# Patient Record
Sex: Female | Born: 1985 | Race: White | Hispanic: No | Marital: Married | Smoking: Never smoker
Health system: Southern US, Community
[De-identification: ages and names within clinical notes are randomized; demographics above are authoritative.]

## PROBLEM LIST (undated history)

## (undated) ENCOUNTER — Inpatient Hospital Stay (HOSPITAL_COMMUNITY): Payer: Self-pay

## (undated) DIAGNOSIS — Z789 Other specified health status: Secondary | ICD-10-CM

## (undated) HISTORY — PX: NO PAST SURGERIES: SHX2092

## (undated) HISTORY — DX: Hemochromatosis, unspecified: E83.119

---

## 2006-06-02 ENCOUNTER — Encounter: Admission: RE | Admit: 2006-06-02 | Discharge: 2006-06-02 | Payer: Self-pay | Admitting: Internal Medicine

## 2006-10-28 ENCOUNTER — Emergency Department (HOSPITAL_COMMUNITY): Admission: EM | Admit: 2006-10-28 | Discharge: 2006-10-29 | Payer: Self-pay | Admitting: Emergency Medicine

## 2007-01-24 ENCOUNTER — Emergency Department (HOSPITAL_COMMUNITY): Admission: EM | Admit: 2007-01-24 | Discharge: 2007-01-25 | Payer: Self-pay | Admitting: Emergency Medicine

## 2008-04-18 ENCOUNTER — Emergency Department (HOSPITAL_COMMUNITY): Admission: EM | Admit: 2008-04-18 | Discharge: 2008-04-18 | Payer: Self-pay | Admitting: Emergency Medicine

## 2010-11-29 LAB — RAPID STREP SCREEN (MED CTR MEBANE ONLY): Streptococcus, Group A Screen (Direct): NEGATIVE

## 2010-12-03 LAB — URINALYSIS, ROUTINE W REFLEX MICROSCOPIC
Ketones, ur: NEGATIVE
Nitrite: NEGATIVE
Protein, ur: NEGATIVE

## 2010-12-03 LAB — HERPES SIMPLEX VIRUS CULTURE

## 2010-12-03 LAB — WET PREP, GENITAL
Trich, Wet Prep: NONE SEEN
Yeast Wet Prep HPF POC: NONE SEEN

## 2010-12-03 LAB — URINE MICROSCOPIC-ADD ON

## 2010-12-03 LAB — GC/CHLAMYDIA PROBE AMP, GENITAL

## 2015-02-14 ENCOUNTER — Emergency Department (INDEPENDENT_AMBULATORY_CARE_PROVIDER_SITE_OTHER)
Admission: EM | Admit: 2015-02-14 | Discharge: 2015-02-14 | Disposition: A | Discharge status: Home / Self Care | Payer: PRIVATE HEALTH INSURANCE | Source: Home / Self Care | Attending: Emergency Medicine | Admitting: Emergency Medicine

## 2015-02-14 ENCOUNTER — Encounter (HOSPITAL_COMMUNITY): Payer: Self-pay

## 2015-02-14 DIAGNOSIS — M6248 Contracture of muscle, other site: Secondary | ICD-10-CM | POA: Diagnosis not present

## 2015-02-14 DIAGNOSIS — S46812A Strain of other muscles, fascia and tendons at shoulder and upper arm level, left arm, initial encounter: Secondary | ICD-10-CM | POA: Diagnosis not present

## 2015-02-14 DIAGNOSIS — M62838 Other muscle spasm: Secondary | ICD-10-CM

## 2015-02-14 MED ORDER — DICLOFENAC SODIUM 75 MG PO TBEC: 75.0000 mg | DELAYED_RELEASE_TABLET | Freq: Two times a day (BID) | ORAL | Status: DC

## 2015-02-14 MED ORDER — METAXALONE 800 MG PO TABS: 800.0000 mg | ORAL_TABLET | Freq: Three times a day (TID) | ORAL | Status: DC

## 2015-02-14 MED ORDER — HYDROCODONE-ACETAMINOPHEN 5-325 MG PO TABS: 2.0000 | ORAL_TABLET | ORAL | Status: DC | PRN

## 2015-02-14 NOTE — Discharge Instructions (Signed)
RICE, deep tissue massage, regular NSAIDs and muscle relaxants. Gentle range of motion exercises.

## 2015-02-14 NOTE — ED Notes (Signed)
Pt stated that she fell at work today and injured her left shoulder. Pt stated that the pain radiates up into her neck and down into her arm Pt a;ert and oriented

## 2015-02-14 NOTE — ED Provider Notes (Signed)
HPI  SUBJECTIVE:  Michelle Wilkins is a 29 y.o. female who presents with left upper shoulder pain described as "pinched nerve" with radiation up her neck and down her shoulder after having a patient fall onto her left shoulder earlier today. 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 Symptoms are worse with rotation of her head, extension, lateral bending to the left, pushing out against resistance, no alleviating factors. She tried Goody's powder. She denies any previous history injury to his shoulder. No numbness, tingling, weakness, bruising, neck pain, limitation of motion. LMP 3 months ago, patient states she is on a long cycle of OCPs and is due next week. she denies the possibility of being pregnant. PMD none   History reviewed. No pertinent past medical history.  History reviewed. No pertinent past surgical history.  History reviewed. No pertinent family history.  Social History  Substance Use Topics  . Smoking status: Never Smoker   . Smokeless tobacco: None  . Alcohol Use: No    No current facility-administered medications for this encounter.  Current outpatient prescriptions:  .  diclofenac (VOLTAREN) 75 MG EC tablet, Take 1 tablet (75 mg total) by mouth 2 (two) times daily. Take with food, Disp: 30 tablet, Rfl: 0 .  HYDROcodone-acetaminophen (NORCO/VICODIN) 5-325 MG tablet, Take 2 tablets by mouth every 4 (four) hours as needed for moderate pain., Disp: 20 tablet, Rfl: 0 .  metaxalone (SKELAXIN) 800 MG tablet, Take 1 tablet (800 mg total) by mouth 3 (three) times daily., Disp: 21 tablet, Rfl: 0  No Known Allergies   ROS  As noted in HPI.   Physical Exam  BP 139/94 mmHg  Pulse 84  Temp(Src) 98.4 F (36.9 C) (Oral)  SpO2 98%  LMP 11/15/2014 (Approximate)  Constitutional: Well developed, well nourished, no acute distress Eyes:  EOMI, conjunctiva normal bilaterally HENT: Normocephalic, atraumatic,mucus membranes moist Neck: No C-spine tenderness.  Respiratory:  Normal inspiratory effort Cardiovascular: Normal rate GI: nondistended skin: No rash, skin intact Musculoskeletal:  Tenderness, muscle spasm along the left trapezius, left  shoulder with ROM normal  Drop test normal, clavicle NT , A/C joint NT, scapula NT, proximal humerus NT, shoulder joint NT, Motor strength normal, Sensation intact LT over deltoid region, distal NVI with hand on affected side having intact sensation and strength in the distribution of the median, radial, and ulnar nerve function.  negative tenderness in bicipital groove, negative empty can test, negative liftoff test Neurologic: Alert & oriented x 3, no focal neuro deficits Psychiatric: Speech and behavior appropriate   ED Course   Medications - No data to display  No orders of the defined types were placed in this encounter.    No results found for this or any previous visit (from the past 24 hour(s)). No results found.  ED Clinical Impression  Trapezius muscle strain, left, initial encounter  Trapezius muscle spasm  ED Assessment/Plan  She has no bony tenderness do not feel that imaging is indicated at this time. presentation is most consistent with trapezial muscle strain/sprain. Home with deep tissue massage, NSAIDs, muscle relaxants, Norco for severe pain. follow-up with occupational health. Recommended [facility] [facility] for ongoing primary care.  Discussed  MDM, plan and followup with patient. Discussed sn/sx that should prompt return to the UC or ED. Patient  agrees with plan.   *This clinic note was created using Dragon dictation software. Therefore, there may be occasional mistakes despite careful proofreading.  ?   Melynda Ripple, MD 02/14/15 1725

## 2015-03-09 ENCOUNTER — Ambulatory Visit: Payer: PRIVATE HEALTH INSURANCE | Attending: Family Medicine

## 2015-03-09 ENCOUNTER — Ambulatory Visit: Payer: PRIVATE HEALTH INSURANCE

## 2015-03-09 DIAGNOSIS — M542 Cervicalgia: Secondary | ICD-10-CM

## 2015-03-09 DIAGNOSIS — R6889 Other general symptoms and signs: Secondary | ICD-10-CM

## 2015-03-09 DIAGNOSIS — R293 Abnormal posture: Secondary | ICD-10-CM

## 2015-03-09 DIAGNOSIS — M436 Torticollis: Secondary | ICD-10-CM

## 2015-03-09 NOTE — Patient Instructions (Signed)
Handout of thoracic and whole body rotation to least restricted side for 12-15 sec  X 3-6 reps 1-2x/day

## 2015-03-09 NOTE — Therapy (Signed)
[name], [place], [address] Phone: [phone]   Fax:  [phone]  Physical Therapy Evaluation  Patient Details  Name: Michelle Wilkins MRN: [id number] Date of Birth: 03-Feb-1986 Referring Provider: Odis Luster , MD  Encounter Date: 03/09/2015      PT End of Session - 03/09/15 1002    Visit Number 1   Number of Visits 8   Date for PT Re-Evaluation 04/06/15   Authorization Type Workers compensation [place] - Visit Number 1   Authorization - Number of Visits 8   PT Start Time 0700   PT Stop Time 0800   PT Time Calculation (min) 60 min   Activity Tolerance Patient tolerated treatment well   Behavior During Therapy [facility] for tasks assessed/performed      No past medical history on file.  No past surgical history on file.  There were no vitals filed for this visit.  Visit Diagnosis:  Neck pain on left side - Plan: PT plan of care cert/re-cert  Abnormal posture - Plan: PT plan of care cert/re-cert  Activity intolerance - Plan: PT plan of care cert/re-cert  Stiffness of neck - Plan: PT plan of care cert/re-cert      Subjective Assessment - 03/09/15 0709    Subjective She reports transfering patient to pt RT  when the patient fell and she caught  the patient  sustaining pull to LT arm and neck when pain started . She reports not able to turn head to LT at the time but reports full ROM now. She continues with spasm though muscle relaxors don't help  and pain and burning in neck and upper back.   Limitations Lifting  No lifting over 10 pounds with LT arm, light duty , n patient care   How long can you sit comfortably? Car 60 min, at home  60 min as she can change postions   How long can you stand comfortably? AS needed   How long can you walk comfortably? As needed   Diagnostic tests None   Patient Stated Goals Reduce pain and return to normal work   Currently in Pain? Yes    Pain Score 4    Pain Location Neck   Pain Orientation Left   Pain Descriptors / Indicators Burning;Cramping  Stiffness   Pain Type --  Sub acute   Pain Radiating Towards From base of occiput to posterior shoulder on LT   Pain Onset 1 to 4 weeks ago   Pain Frequency Constant   Aggravating Factors  repetative cervical extension, endof day burning and cramping worse   Pain Relieving Factors ice (doen't last), anti inflammatory, rest   Multiple Pain Sites No            [facility] PT Assessment - 03/09/15 0701    Assessment   Medical Diagnosis Thoracic spine strain   Referring Provider Odis Luster , MD   Onset Date/Surgical Date 02/14/15   Hand Dominance Right   Next MD Visit 03/24/15   Prior Therapy No  She did gentle ROM    Precautions   Precautions None   Restrictions   Weight Bearing Restrictions No   Balance Screen   Has the patient fallen in the past 6 months No   Has the patient had a decrease in activity level because of a fear of falling?  No   Prior Function   Level of Independence Independent   Vocation Full time employment  Continue as staff PT   Vocation Requirements She is limited to 10 pounds LT arm   Leisure walking dog  if dog pulls   Cognition   Overall Cognitive Status Within Functional Limits for tasks assessed   Coordination   Gross Motor Movements are Fluid and Coordinated Yes   Posture/Postural Control   Posture Comments Sits with slumped posture   ROM / Strength   AROM / PROM / Strength AROM;Strength;PROM   AROM   AROM Assessment Site Cervical   Cervical Flexion 55   Cervical Extension 70   Cervical - Right Side Bend 80   Cervical - Left Side Bend 70   Cervical - Right Rotation 30   Cervical - Left Rotation 40   PROM   PROM Assessment Site Cervical   Strength   Overall Strength Comments WNL RT and LT UE   Palpation   Palpation comment Possible LT rotation C7 to LT  , tender same area to touch and along LT paraspinals   Special Tests     Special Tests Cervical   Cervical Tests Spurling's   Spurling's   Findings Positive   Side Left   Comment pain more local at C7    Ambulation/Gait   Gait Comments normal                   [facility] Adult PT Treatment/Exercise - 03/09/15 0701    Exercises   Exercises Neck   Neck Exercises: Seated   Other Seated Exercise Total motion based thoracic rotation to RT x3 with flexion with improved LT rotation and discomfort.    Modalities   Modalities Electrical Stimulation;Moist Heat   Moist Heat Therapy   Number Minutes Moist Heat 20 Minutes   Moist Heat Location Cervical   Electrical Stimulation   Electrical Stimulation Location Cervical LT to shoulder   Electrical Stimulation Action IFC   Electrical Stimulation Parameters L8   Electrical Stimulation Goals Pain                PT Education - 03/09/15 0751    Education provided Yes   Education Details POC, rotation stretches   Person(s) Educated Patient   Methods Explanation;Demonstration;Verbal cues;Handout   Comprehension Verbalized understanding;Returned demonstration          PT Short Term Goals - 03/09/15 1007    PT SHORT TERM GOAL #1   Title She will be independent  with initial HEP   Time 2   Period Weeks   Status New   PT SHORT TERM GOAL #2   Title She will report decreased burning in LT neck by 50 % or more   Time 2   Period Weeks   Status New   PT SHORT TERM GOAL #3   Title she will improve LT neck rotation improved  to 80 degrees to equal RT rotation   Time 2   Period Weeks   Status New           PT Long Term Goals - 03/09/15 1008    PT LONG TERM GOAL #1   Title She will be independent with all HEP    Time 4   Period Weeks   Status New   PT LONG TERM GOAL #2   Title She will report no pain/burning in LT neck with general activity at home and work.    Time 4   Period Weeks   Status New   PT LONG TERM GOAL #3   Title She  will be able to lift from floor to waist and knee to  waist 25 pounds with min to no burning /pain to begin to RTW on IP rehab full duty   Time 4   Period Weeks   Status New               Plan - 03/09/15 1003    Clinical Impression Statement Ms Penven-Crew presents with constant burning sensation into LT neck to shoulder. She has mild stiffness with LT cervical rotation. She has increased pain with combo extension/rot/side bend to LT and not to RT. She is on light duty at work. She should do well with PT to resolve issues   Pt will benefit from skilled therapeutic intervention in order to improve on the following deficits Pain;Postural dysfunction;Decreased activity tolerance;Decreased range of motion;Increased muscle spasms   Rehab Potential Good   PT Frequency 2x / week   PT Duration 4 weeks  and if improving extend for 16 visits   PT Treatment/Interventions Iontophoresis 4mg /ml Dexamethasone;Moist Heat;Cryotherapy;Electrical Stimulation;Ultrasound;Therapeutic exercise;Patient/family education;Manual techniques;Passive range of motion;Dry needling;Taping;Therapeutic activities   PT Next Visit Plan Manual techniques and modalities, rotation stretching with total motion trunk rotation   PT Home Exercise Plan trunk rotation to less tiff side to see if neck and trunk ROm and poain improved   Consulted and Agree with Plan of Care Patient         Problem List There are no active problems to display for this patient.   Darrel Hoover PT 03/09/2015, 10:15 AM  [facility] [address] [place], [place], [address] Phone: [phone]   Fax:  [phone]  Name: Michelle Wilkins MRN: [id number] Date of Birth: [birth year]

## 2015-03-10 ENCOUNTER — Ambulatory Visit: Payer: PRIVATE HEALTH INSURANCE | Attending: Family Medicine | Admitting: Physical Therapy

## 2015-03-10 DIAGNOSIS — R293 Abnormal posture: Secondary | ICD-10-CM | POA: Diagnosis present

## 2015-03-10 DIAGNOSIS — M436 Torticollis: Secondary | ICD-10-CM | POA: Diagnosis present

## 2015-03-10 DIAGNOSIS — M542 Cervicalgia: Secondary | ICD-10-CM | POA: Insufficient documentation

## 2015-03-10 DIAGNOSIS — R6889 Other general symptoms and signs: Secondary | ICD-10-CM | POA: Insufficient documentation

## 2015-03-10 NOTE — Therapy (Signed)
[place], [place], [address] Phone: [phone]   Fax:  [phone]  Physical Therapy Treatment  Patient Details  Name: Michelle Wilkins MRN: [id number] Date of Birth: January 07, 1986 Referring Provider: Odis Luster , MD  Encounter Date: 03/10/2015      PT End of Session - 03/10/15 2021    Visit Number 2   Number of Visits 8   Date for PT Re-Evaluation 04/06/15   Authorization Type Workers compensation [place]   PT Start Time 1630   PT Stop Time 1725   PT Time Calculation (min) 55 min   Activity Tolerance Patient tolerated treatment well   Behavior During Therapy [facility] for tasks assessed/performed      No past medical history on file.  No past surgical history on file.  There were no vitals filed for this visit.  Visit Diagnosis:  Neck pain on left side  Abnormal posture  Activity intolerance  Stiffness of neck      Subjective Assessment - 03/10/15 1636    Subjective "Kirk Ruths e-stime took some of the sorness and burning out of the shoulder"   Currently in Pain? Yes   Pain Score 1    Pain Location Neck   Pain Orientation Left   Pain Onset 1 to 4 weeks ago   Pain Frequency Constant                         [facility] Adult PT Treatment/Exercise - 03/10/15 1719    Neck Exercises: Supine   Neck Retraction 10 reps;5 secs   Shoulder ABduction 15 reps  horizontal with red theraband   Upper Extremity D2 10 reps;Theraband   Theraband Level (UE D2) Level 2 (Red)   Moist Heat Therapy   Number Minutes Moist Heat 10 Minutes   Moist Heat Location Cervical;Shoulder  in supine   Manual Therapy   Manual Therapy Soft tissue mobilization   Soft tissue mobilization instrument assisted STM over the L upper trap/ levator scap, and rhomboids, and cervical multifidus          Trigger Point Dry Needling - 03/10/15 1707    Consent Given? Yes   Education Handout Provided Yes   Muscles Treated Upper Body Upper trapezius;Levator scapulae;Rhomboids  cervical mulifidus   Upper Trapezius Response Twitch reponse elicited;Palpable increased muscle length   Levator Scapulae Response Twitch response elicited;Palpable increased muscle length   Rhomboids Response Twitch response elicited;Palpable increased muscle length              PT Education - 03/10/15 2021    Education provided Yes   Education Details dry needling education   Person(s) Educated Patient   Methods Explanation   Comprehension Verbalized understanding          PT Short Term Goals - 03/09/15 1007    PT SHORT TERM GOAL #1   Title She will be independent  with initial HEP   Time 2   Period Weeks   Status New   PT SHORT TERM GOAL #2   Title She will report decreased burning in LT neck by 50 % or more   Time 2   Period Weeks   Status New   PT SHORT TERM GOAL #3   Title she will improve LT neck rotation improved  to 80 degrees to equal RT rotation   Time 2   Period Weeks   Status New  PT Long Term Goals - 03/09/15 1008    PT LONG TERM GOAL #1   Title She will be independent with all HEP    Time 4   Period Weeks   Status New   PT LONG TERM GOAL #2   Title She will report no pain/burning in LT neck with general activity at home and work.    Time 4   Period Weeks   Status New   PT LONG TERM GOAL #3   Title She will be able to lift from floor to waist and knee to waist 25 pounds with min to no burning /pain to begin to RTW on IP rehab full duty   Time 4   Period Weeks   Status New               Plan - 03/10/15 2022    Clinical Impression Statement Jacklin reports that she is doing better today. She provided consent for dry needing and reported multiple twitches in the L upper trap, levator scapulae and rhomboids. Follow she reported relief with the instrument assisted STM and was able to perform the exercises given without complaint. provided MHP following  todays session to derease DN sorenss which she reproted helped.    PT Next Visit Plan assess response to DN, Manual techniques and modalities, rotation stretching with total motion trunk rotation   Consulted and Agree with Plan of Care Patient        Problem List There are no active problems to display for this patient.  Starr Lake PT, DPT, LAT, ATC  03/10/2015  8:29 PM     [facility] [address] [place], [place], [address] Phone: [phone]   Fax:  [phone]  Name: Michelle Wilkins MRN: [id number] Date of Birth: [birth year]

## 2015-03-16 ENCOUNTER — Ambulatory Visit: Payer: PRIVATE HEALTH INSURANCE | Admitting: Physical Therapy

## 2015-03-16 DIAGNOSIS — M542 Cervicalgia: Secondary | ICD-10-CM

## 2015-03-16 DIAGNOSIS — M436 Torticollis: Secondary | ICD-10-CM

## 2015-03-16 DIAGNOSIS — R6889 Other general symptoms and signs: Secondary | ICD-10-CM

## 2015-03-16 DIAGNOSIS — R293 Abnormal posture: Secondary | ICD-10-CM

## 2015-03-16 NOTE — Therapy (Signed)
[name], [place], [address] Phone: [phone]   Fax:  [phone]  Physical Therapy Treatment  Patient Details  Name: Michelle Wilkins MRN: [id number] Date of Birth: 1985/11/15 Referring Provider: Odis Luster , MD  Encounter Date: 03/16/2015      PT End of Session - 03/16/15 1729    Visit Number 3   Number of Visits 8   Date for PT Re-Evaluation 04/06/15   Authorization Type Workers compensation [place]   PT Start Time 0430   PT Stop Time 0530   PT Time Calculation (min) 60 min   Activity Tolerance Patient tolerated treatment well   Behavior During Therapy [facility] for tasks assessed/performed      No past medical history on file.  No past surgical history on file.  There were no vitals filed for this visit.  Visit Diagnosis:  Neck pain on left side  Abnormal posture  Activity intolerance  Stiffness of neck      Subjective Assessment - 03/16/15 1635    Subjective I am not haveing any radiating pain since last week.  Today I have some burning. and some pain in my Right rhomboid. 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 No/denies  stiffness and tightness   Pain Location Neck   Pain Orientation Left            [facility] PT Assessment - 03/16/15 1708    AROM   Cervical Flexion 65   Cervical Extension 88   Cervical - Right Side Bend 70   Cervical - Left Side Bend 65   Cervical - Right Rotation 63   Cervical - Left Rotation 65   Palpation   Palpation comment Lt rotation C4 to C-7, C 3 rotatied right                     [facility] Adult PT Treatment/Exercise - 03/16/15 1721    Neck Exercises: Supine   Other Supine Exercise Deep neck flexion x 10 with towel roll    Moist Heat Therapy   Number Minutes Moist Heat 15 Minutes   Moist Heat Location Cervical;Shoulder  bilateral   Manual Therapy   Manual Therapy Soft tissue mobilization    Joint Mobilization cervical PA glide, lateral glides bil and overpressure for rotation.   Soft tissue mobilization instrument assisted STM over the L upper trap/ levator scap, and rhomboids, and cervical multifidus   Scapular Mobilization inf/sup, protraction/retraction   Neck Exercises: Stretches   Upper Trapezius Stretch 2 reps;30 seconds  left    Levator Stretch 2 reps;30 seconds  left   Other Neck Stretches utilizing Mulligan technique with towel with distraction and side lean 30 sec hold           Trigger Point Dry Needling - 03/16/15 1640    Consent Given? Yes   Education Handout Provided No  previously given   Muscles Treated Upper Body Upper trapezius;Levator scapulae;Rhomboids;Subscapularis;Suboccipitals muscle group  erector spinae C3 R and L rib 4, 5, 6 left    Upper Trapezius Response Twitch reponse elicited;Palpable increased muscle length  left   SubOccipitals Response Palpable increased muscle length  right   Levator Scapulae Response Palpable increased muscle length  left   Rhomboids Response Twitch response elicited;Palpable increased muscle length   Subscapularis Response Twitch response elicited;Palpable increased muscle length      Pt monitored closely for response to TDN  PT Short Term Goals - 03/16/15 1634    PT SHORT TERM GOAL #1   Title She will be independent  with initial HEP   Time 2   Period Weeks   Status On-going   PT SHORT TERM GOAL #2   Title She will report decreased burning in LT neck by 50 % or more   Time 2   Period Weeks   Status On-going   PT SHORT TERM GOAL #3   Title she will improve LT neck rotation improved  to 80 degrees to equal RT rotation   Time 2   Period Weeks   Status On-going           PT Long Term Goals - 03/16/15 1634    PT LONG TERM GOAL #1   Title She will be independent with all HEP    Time 4   Period Weeks   Status On-going   PT LONG TERM GOAL #2   Title She will report no pain/burning  in LT neck with general activity at home and work.    Time 4   Period Weeks   Status On-going   PT LONG TERM GOAL #3   Title She will be able to lift from floor to waist and knee to waist 25 pounds with min to no burning /pain to begin to RTW on IP rehab full duty   Time 4   Period Weeks   Status On-going               Plan - 03/16/15 1729    Clinical Impression Statement Hiawatha reports that she has no radiating pain down her arm..  pt with residual rhomboid burning and Left subscapular pain.  Pt with rotatied to right C-3 spinous process. AROM is WNL  cervical flexion 65, ext 88, Rgiht Side bend 70, left 65, Right rotation 63, left rotation 65.  Pt reviewed Upper trap, Levator stretch.   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   Pt will benefit from skilled therapeutic intervention in order to improve on the following deficits Pain;Postural dysfunction;Decreased activity tolerance;Decreased range of motion;Increased muscle spasms   Rehab Potential Good   PT Frequency 2x / week   PT Duration 4 weeks   PT Treatment/Interventions Iontophoresis 4mg /ml Dexamethasone;Moist Heat;Cryotherapy;Electrical Stimulation;Ultrasound;Therapeutic exercise;Patient/family education;Manual techniques;Passive range of motion;Dry needling;Taping;Therapeutic activities   PT Next Visit Plan  Manual techniques and modalities, review exercises , stretches and deep neck flexion   PT Home Exercise Plan Continue HEP   Consulted and Agree with Plan of Care Patient        Problem List There are no active problems to display for this patient.   Michelle Wilkins, PT 03/16/2015 5:38 PM Phone: [phone] Fax: [facility] [address] [place], [place], [address] Phone: [phone]   Fax:  [phone]  Name: Michelle Wilkins MRN: [id number] Date of Birth: [birth year]

## 2015-03-18 ENCOUNTER — Ambulatory Visit: Payer: PRIVATE HEALTH INSURANCE | Admitting: Physical Therapy

## 2015-03-18 DIAGNOSIS — R6889 Other general symptoms and signs: Secondary | ICD-10-CM

## 2015-03-18 DIAGNOSIS — R293 Abnormal posture: Secondary | ICD-10-CM

## 2015-03-18 DIAGNOSIS — M436 Torticollis: Secondary | ICD-10-CM

## 2015-03-18 DIAGNOSIS — M542 Cervicalgia: Secondary | ICD-10-CM | POA: Diagnosis not present

## 2015-03-18 NOTE — Therapy (Signed)
[place], [place], [address] Phone: [phone]   Fax:  [phone]  Physical Therapy Treatment  Patient Details  Name: Michelle Wilkins MRN: [id number] Date of Birth: Oct 24, 1985 Referring Provider: Odis Luster , MD  Encounter Date: 03/18/2015      PT End of Session - 03/18/15 1632    Visit Number 4   Number of Visits 8   Date for PT Re-Evaluation 04/06/15   Authorization Type Workers compensation [place]   PT Start Time 1545   PT Stop Time 1628   PT Time Calculation (min) 43 min   Activity Tolerance Patient tolerated treatment well   Behavior During Therapy [facility] for tasks assessed/performed      No past medical history on file.  No past surgical history on file.  There were no vitals filed for this visit.  Visit Diagnosis:  Neck pain on left side  Abnormal posture  Activity intolerance  Stiffness of neck      Subjective Assessment - 03/18/15 1551    Subjective I am doing very well no pain or radiating pain, since last thursday or Friday.    Currently in Pain? No/denies                         [facility] Adult PT Treatment/Exercise - 03/18/15 0001    Neck Exercises: Machines for Strengthening   UBE (Upper Arm Bike) L 1.5 x 6 min  alternating direction every 3 min   Other Machines for Strengthening lat pull down machine 2 x 15 1 plate   Neck Exercises: Standing   Neck Retraction 10 reps;3 secs  3 sets, straight back, with L rot/ R rot   Neck Retraction Limitations using wall for feedback   Other Standing Exercises freemotion row/ bench press 1 x 10 10#   Other Standing Exercises bil shoulder flexion/ extension with sustained abduction against bolster with red theraband 2 x 15   Neck Exercises: Prone   Other Prone Exercise hanging off table bil serratus press 2 x 15   Neck Exercises: Stretches   Upper Trapezius Stretch 2 reps;30 seconds   Levator Stretch 2  reps;30 seconds   Other Neck Stretches Rhomboid stretch 1 x 30 sec from door knob with arms crossed                PT Education - 03/18/15 1632    Education provided Yes   Education Details HEP Review   Person(s) Educated Patient   Methods Explanation   Comprehension Verbalized understanding          PT Short Term Goals - 03/16/15 1634    PT SHORT TERM GOAL #1   Title She will be independent  with initial HEP   Time 2   Period Weeks   Status On-going   PT SHORT TERM GOAL #2   Title She will report decreased burning in LT neck by 50 % or more   Time 2   Period Weeks   Status On-going   PT SHORT TERM GOAL #3   Title she will improve LT neck rotation improved  to 80 degrees to equal RT rotation   Time 2   Period Weeks   Status On-going           PT Long Term Goals - 03/16/15 1634    PT LONG TERM GOAL #1   Title She will be independent with all HEP  Time 4   Period Weeks   Status On-going   PT LONG TERM GOAL #2   Title She will report no pain/burning in LT neck with general activity at home and work.    Time 4   Period Weeks   Status On-going   PT LONG TERM GOAL #3   Title She will be able to lift from floor to waist and knee to waist 25 pounds with min to no burning /pain to begin to RTW on IP rehab full duty   Time 4   Period Weeks   Status On-going               Plan - 03/18/15 1633    Clinical Impression Statement Michelle Wilkins reports no pain in the neck or radiating symptoms. Focused todays session on strengthening of the scapular stabilizers and cervical flexors. She was able to complete all exercises wihthout complaint and reported some soreness in the L rhomboid region following todays session. following rhomboid stretching from the door she reported decreased pain. Plan to progress with strengthening as tolerated.    PT Next Visit Plan  Manual techniques and modalities PRN, review exercises , stretches and deep neck flexion, scapular  stabilizers   Consulted and Agree with Plan of Care Patient        Problem List There are no active problems to display for this patient.  Starr Lake PT, DPT, LAT, ATC  03/18/2015  4:36 PM      [facility] [address] [place], [place], [address] Phone: [phone]   Fax:  [phone]  Name: Michelle Wilkins MRN: [id number] Date of Birth: 07-06-1985

## 2015-03-23 ENCOUNTER — Ambulatory Visit: Payer: PRIVATE HEALTH INSURANCE | Admitting: Physical Therapy

## 2015-03-23 DIAGNOSIS — R293 Abnormal posture: Secondary | ICD-10-CM

## 2015-03-23 DIAGNOSIS — M542 Cervicalgia: Secondary | ICD-10-CM

## 2015-03-23 DIAGNOSIS — M436 Torticollis: Secondary | ICD-10-CM

## 2015-03-23 DIAGNOSIS — R6889 Other general symptoms and signs: Secondary | ICD-10-CM

## 2015-03-23 NOTE — Therapy (Signed)
[place], [place], [address] Phone: [phone]   Fax:  [phone]  Physical Therapy Treatment  Patient Details  Name: Michelle Wilkins MRN: [id number] Date of Birth: 01-06-1986 Referring Provider: Odis Luster , MD  Encounter Date: 03/23/2015      PT End of Session - 03/23/15 1717    Visit Number 5   Number of Visits 8   Date for PT Re-Evaluation 04/06/15   PT Start Time 1630   PT Stop Time 1712   PT Time Calculation (min) 42 min   Activity Tolerance Patient tolerated treatment well   Behavior During Therapy [facility] for tasks assessed/performed      No past medical history on file.  No past surgical history on file.  There were no vitals filed for this visit.  Visit Diagnosis:  Neck pain on left side  Abnormal posture  Activity intolerance  Stiffness of neck      Subjective Assessment - 03/23/15 1629    Subjective "I am doing pretty, some soreness after the last session but stretching helped take the soreness away"    Currently in Pain? No/denies                         [facility] Adult PT Treatment/Exercise - 03/23/15 1636    Neck Exercises: Machines for Strengthening   UBE (Upper Arm Bike) L 1.5 x 8 min  alternating direction at 4 min   Neck Exercises: Standing   Upper Extremity D1 10 reps;Theraband   Lift / Chop 10 reps with red theraband    Other Standing Exercises freemotion row/ bench press 2 x 12 7#   Other Standing Exercises supine foam roll routine   Neck Exercises: Prone   Other Prone Exercise walks-out with feet on physioball  x 10   cues to keep shoulders protracted   Neck Exercises: Stretches   Upper Trapezius Stretch 2 reps;30 seconds   Levator Stretch 2 reps;30 seconds   Other Neck Stretches Rhomboid stretch 1 x 30 sec from door knob with arms crossed                PT Education - 03/23/15 1717    Education provided Yes   Education  Details to cancel next visit and assess 1 week out how theprogress she is making.    Person(s) Educated Patient   Methods Explanation   Comprehension Verbalized understanding          PT Short Term Goals - 03/23/15 1719    PT SHORT TERM GOAL #1   Title She will be independent  with initial HEP   Time 2   Period Weeks   Status Achieved   PT SHORT TERM GOAL #2   Title She will report decreased burning in LT neck by 50 % or more   Time 2   Period Weeks   Status Achieved   PT SHORT TERM GOAL #3   Title she will improve LT neck rotation improved  to 80 degrees to equal RT rotation   Time 2   Period Weeks   Status Unable to assess           PT Long Term Goals - 03/23/15 1720    PT LONG TERM GOAL #1   Title She will be independent with all HEP    Time 4   Period Weeks   Status On-going   PT LONG TERM GOAL #2  Title She will report no pain/burning in LT neck with general activity at home and work.    Time 4   Period Weeks   Status Achieved   PT LONG TERM GOAL #3   Title She will be able to lift from floor to waist and knee to waist 25 pounds with min to no burning /pain to begin to RTW on IP rehab full duty   Period Weeks   Status On-going               Plan - 03/23/15 1717    Clinical Impression Statement Lennie stated she had some soreness after last session but stated it was relieved with stretching and hasn't had any pain for a while. She did well with exercises reporting no pain during or following activiity. Decided to cancel next visit to assess how she is doing to determine if she is nearing discharge.    PT Next Visit Plan  Manual techniques and modalities PRN, review exercises , stretches and deep neck flexion, scapular stabilizers   PT Home Exercise Plan Continue HEP   Consulted and Agree with Plan of Care Patient        Problem List There are no active problems to display for this patient.  Starr Lake PT, DPT, LAT, ATC  03/23/2015   5:21 PM     [facility] [address] [place], [place], [address] Phone: [phone]   Fax:  [phone]  Name: Michelle Wilkins MRN: [id number] Date of Birth: Aug 09, 1985

## 2015-03-26 ENCOUNTER — Encounter: Payer: Self-pay | Admitting: Physical Therapy

## 2015-03-30 ENCOUNTER — Ambulatory Visit: Payer: PRIVATE HEALTH INSURANCE | Attending: Family Medicine | Admitting: Physical Therapy

## 2015-03-30 DIAGNOSIS — M436 Torticollis: Secondary | ICD-10-CM | POA: Insufficient documentation

## 2015-03-30 DIAGNOSIS — M542 Cervicalgia: Secondary | ICD-10-CM | POA: Insufficient documentation

## 2015-03-30 DIAGNOSIS — R293 Abnormal posture: Secondary | ICD-10-CM | POA: Diagnosis present

## 2015-03-30 DIAGNOSIS — R6889 Other general symptoms and signs: Secondary | ICD-10-CM | POA: Diagnosis present

## 2015-03-30 NOTE — Therapy (Signed)
[place], [place], [address] Phone: [phone]   Fax:  [phone]  Physical Therapy Treatment / Discharge note  Patient Details  Name: Michelle Wilkins MRN: [id number] Date of Birth: 06/19/1985 Referring Provider: Odis Luster , MD  Encounter Date: 03/30/2015      PT End of Session - 03/30/15 1700    Visit Number 6   Number of Visits 8   Date for PT Re-Evaluation 04/06/15   Authorization Type Workers compensation [place]   PT Start Time 1630   PT Stop Time 1700   PT Time Calculation (min) 30 min   Activity Tolerance Patient tolerated treatment well   Behavior During Therapy [facility] for tasks assessed/performed      No past medical history on file.  No past surgical history on file.  There were no vitals filed for this visit.  Visit Diagnosis:  Neck pain on left side  Abnormal posture  Stiffness of neck  Activity intolerance      Subjective Assessment - 03/30/15 1634    Subjective " I am doing pretty good, I almost worked a full shift and had no problems"   Currently in Pain? No/denies            [facility] PT Assessment - 03/30/15 1637    Observation/Other Assessments   Focus on Therapeutic Outcomes (FOTO)  16% limited   AROM   Cervical Flexion 66   Cervical Extension 90   Cervical - Right Side Bend 70   Cervical - Left Side Bend 66   Cervical - Right Rotation 70   Cervical - Left Rotation 66                             PT Education - 03/30/15 1659    Education provided Yes   Education Details HEP review   Person(s) Educated Patient   Methods Explanation   Comprehension Verbalized understanding          PT Short Term Goals - 03/30/15 1647    PT SHORT TERM GOAL #1   Title She will be independent  with initial HEP   Time 2   Period Weeks   Status Achieved   PT SHORT TERM GOAL #2   Title She will report decreased burning in LT neck by 50 %  or more   Time 2   Period Weeks   Status Achieved   PT SHORT TERM GOAL #3   Title she will improve LT neck rotation improved  to 80 degrees to equal RT rotation   Time 2   Period Weeks   Status Not Met           PT Long Term Goals - 03/30/15 1648    PT LONG TERM GOAL #1   Title She will be independent with all HEP    Time 4   Period Weeks   Status Achieved   PT LONG TERM GOAL #2   Title She will report no pain/burning in LT neck with general activity at home and work.    Time 4   Period Weeks   Status Achieved   PT LONG TERM GOAL #3   Title She will be able to lift from floor to waist and knee to waist 25 pounds with min to no burning /pain to begin to RTW on IP rehab full duty   Time 4   Period Weeks  Status Achieved               Plan - 03/30/15 1701    Clinical Impression Statement Michelle Wilkins reports that she has been doing very well and has worked pretty much a full day without any issues. She has improved her cervical mobility with no report of pain or tightness. She has met all goals except for STG #3. She reports that she is able to maintain and progress her current level of function independently and will be discharged from physical therapy today.    PT Next Visit Plan D/C from physical therapy   PT Home Exercise Plan HEP Review   Consulted and Agree with Plan of Care Patient        Problem List There are no active problems to display for this patient.  Starr Lake PT, DPT, LAT, ATC  03/30/2015  5:08 PM     [place] [facility] [address] [place], [place], [address] Phone: [phone]   Fax:  [phone]  Name: Michelle Wilkins MRN: [id number] Date of Birth: 04-29-1985    PHYSICAL THERAPY DISCHARGE SUMMARY  Visits from Start of Care: 6  Current functional level related to goals / functional outcomes: FOTO 16% limited   Remaining deficits: N/A   Education / Equipment: HEP   Plan: Patient agrees to discharge.  Patient goals were partially met. Patient is being discharged due to being pleased with the current functional level.  ?????

## 2015-04-01 ENCOUNTER — Encounter: Payer: Self-pay | Admitting: Physical Therapy

## 2015-04-13 MED FILL — EMOQUETTE 28 DAY TABLET: 0.15-30 | 63 days supply | Qty: 84 | Fill #1

## 2015-06-03 DIAGNOSIS — H5213 Myopia, bilateral: Secondary | ICD-10-CM | POA: Diagnosis not present

## 2015-06-15 DIAGNOSIS — D1801 Hemangioma of skin and subcutaneous tissue: Secondary | ICD-10-CM | POA: Diagnosis not present

## 2015-06-15 DIAGNOSIS — D235 Other benign neoplasm of skin of trunk: Secondary | ICD-10-CM | POA: Diagnosis not present

## 2015-06-15 DIAGNOSIS — L812 Freckles: Secondary | ICD-10-CM | POA: Diagnosis not present

## 2015-07-07 MED FILL — JULEBER 0.15-30 MG-MCG TABS: 0.15-30 | 63 days supply | Qty: 84 | Fill #2

## 2015-08-26 DIAGNOSIS — Z1389 Encounter for screening for other disorder: Secondary | ICD-10-CM | POA: Diagnosis not present

## 2015-08-26 DIAGNOSIS — Z6822 Body mass index (BMI) 22.0-22.9, adult: Secondary | ICD-10-CM | POA: Diagnosis not present

## 2015-08-26 DIAGNOSIS — Z01419 Encounter for gynecological examination (general) (routine) without abnormal findings: Secondary | ICD-10-CM | POA: Diagnosis not present

## 2015-08-26 DIAGNOSIS — Z130 Encounter for screening for diseases of the blood and blood-forming organs and certain disorders involving the immune mechanism: Secondary | ICD-10-CM | POA: Diagnosis not present

## 2015-08-26 DIAGNOSIS — Z793 Long term (current) use of hormonal contraceptives: Secondary | ICD-10-CM | POA: Diagnosis not present

## 2015-09-08 MED FILL — JULEBER 0.15-30 MG-MCG TABS: 0.15-30 | 63 days supply | Qty: 84 | Fill #0

## 2015-09-24 DIAGNOSIS — Z3202 Encounter for pregnancy test, result negative: Secondary | ICD-10-CM | POA: Diagnosis not present

## 2015-09-24 DIAGNOSIS — Z30430 Encounter for insertion of intrauterine contraceptive device: Secondary | ICD-10-CM | POA: Diagnosis not present

## 2015-10-23 DIAGNOSIS — Z30431 Encounter for routine checking of intrauterine contraceptive device: Secondary | ICD-10-CM | POA: Diagnosis not present

## 2016-06-09 DIAGNOSIS — H5213 Myopia, bilateral: Secondary | ICD-10-CM | POA: Diagnosis not present

## 2016-06-23 DIAGNOSIS — L821 Other seborrheic keratosis: Secondary | ICD-10-CM | POA: Diagnosis not present

## 2016-06-23 DIAGNOSIS — L814 Other melanin hyperpigmentation: Secondary | ICD-10-CM | POA: Diagnosis not present

## 2016-06-23 DIAGNOSIS — D225 Melanocytic nevi of trunk: Secondary | ICD-10-CM | POA: Diagnosis not present

## 2016-10-18 DIAGNOSIS — Z130 Encounter for screening for diseases of the blood and blood-forming organs and certain disorders involving the immune mechanism: Secondary | ICD-10-CM | POA: Diagnosis not present

## 2016-10-18 DIAGNOSIS — Z01419 Encounter for gynecological examination (general) (routine) without abnormal findings: Secondary | ICD-10-CM | POA: Diagnosis not present

## 2016-10-18 DIAGNOSIS — Z975 Presence of (intrauterine) contraceptive device: Secondary | ICD-10-CM | POA: Diagnosis not present

## 2016-10-18 DIAGNOSIS — Z124 Encounter for screening for malignant neoplasm of cervix: Secondary | ICD-10-CM | POA: Diagnosis not present

## 2016-10-18 DIAGNOSIS — Z1151 Encounter for screening for human papillomavirus (HPV): Secondary | ICD-10-CM | POA: Diagnosis not present

## 2016-10-18 DIAGNOSIS — Z1389 Encounter for screening for other disorder: Secondary | ICD-10-CM | POA: Diagnosis not present

## 2016-10-18 DIAGNOSIS — N644 Mastodynia: Secondary | ICD-10-CM | POA: Diagnosis not present

## 2016-12-01 DIAGNOSIS — Z13220 Encounter for screening for lipoid disorders: Secondary | ICD-10-CM | POA: Diagnosis not present

## 2016-12-01 DIAGNOSIS — Z0000 Encounter for general adult medical examination without abnormal findings: Secondary | ICD-10-CM | POA: Diagnosis not present

## 2016-12-01 DIAGNOSIS — G43909 Migraine, unspecified, not intractable, without status migrainosus: Secondary | ICD-10-CM | POA: Diagnosis not present

## 2016-12-12 DIAGNOSIS — R05 Cough: Secondary | ICD-10-CM | POA: Diagnosis not present

## 2016-12-12 DIAGNOSIS — J029 Acute pharyngitis, unspecified: Secondary | ICD-10-CM | POA: Diagnosis not present

## 2016-12-12 DIAGNOSIS — J0100 Acute maxillary sinusitis, unspecified: Secondary | ICD-10-CM | POA: Diagnosis not present

## 2017-01-10 ENCOUNTER — Ambulatory Visit
Admission: RE | Admit: 2017-01-10 | Discharge: 2017-01-10 | Disposition: A | Discharge status: Home / Self Care | Payer: 59 | Source: Ambulatory Visit | Attending: Physician Assistant | Admitting: Physician Assistant

## 2017-01-10 ENCOUNTER — Other Ambulatory Visit: Payer: Self-pay | Admitting: Physician Assistant

## 2017-01-10 DIAGNOSIS — M542 Cervicalgia: Secondary | ICD-10-CM

## 2017-01-10 DIAGNOSIS — S199XXA Unspecified injury of neck, initial encounter: Secondary | ICD-10-CM | POA: Diagnosis not present

## 2017-01-10 MED FILL — CYCLOBENZAPRINE 10 MG TAB: 10 | 5 days supply | Qty: 15 | Fill #0

## 2017-01-10 MED FILL — NAPROXEN 500 MG TABLET: 500 | 15 days supply | Qty: 30 | Fill #0

## 2017-01-18 DIAGNOSIS — Z30432 Encounter for removal of intrauterine contraceptive device: Secondary | ICD-10-CM | POA: Diagnosis not present

## 2017-02-21 NOTE — L&D Delivery Note (Signed)
Delivery Note At 12:06 PM a viable female was delivered via Vaginal, Spontaneous (Presentation: vtx; LOA ).  APGAR: 9, 9; weight pending.   Placenta status: spontaneous, intact.  Cord:  with the following complications: none.   Anesthesia:  Epidural Episiotomy: Median-for tight perineal body delaying delivery Lacerations: None Suture Repair: 2.0 3.0 vicryl rapide Est. Blood Loss (mL):  300  Mom to postpartum.  Baby to Couplet care / Skin to Skin.  Will do circ tomorrow.  Michelle Wilkins 01/13/2018, 12:35 PM

## 2017-03-23 DIAGNOSIS — J0100 Acute maxillary sinusitis, unspecified: Secondary | ICD-10-CM | POA: Diagnosis not present

## 2017-03-23 DIAGNOSIS — J029 Acute pharyngitis, unspecified: Secondary | ICD-10-CM | POA: Diagnosis not present

## 2017-03-23 DIAGNOSIS — J111 Influenza due to unidentified influenza virus with other respiratory manifestations: Secondary | ICD-10-CM | POA: Diagnosis not present

## 2017-05-23 ENCOUNTER — Other Ambulatory Visit: Payer: Self-pay

## 2017-05-23 ENCOUNTER — Encounter (HOSPITAL_COMMUNITY): Payer: Self-pay | Admitting: *Deleted

## 2017-05-23 ENCOUNTER — Inpatient Hospital Stay (HOSPITAL_COMMUNITY)
Admission: AD | Admit: 2017-05-23 | Discharge: 2017-05-23 | Disposition: A | Discharge status: Home / Self Care | Payer: 59 | Source: Ambulatory Visit | Attending: Obstetrics and Gynecology | Admitting: Obstetrics and Gynecology

## 2017-05-23 DIAGNOSIS — R112 Nausea with vomiting, unspecified: Secondary | ICD-10-CM | POA: Diagnosis not present

## 2017-05-23 DIAGNOSIS — Z3201 Encounter for pregnancy test, result positive: Secondary | ICD-10-CM | POA: Diagnosis not present

## 2017-05-23 DIAGNOSIS — O210 Mild hyperemesis gravidarum: Secondary | ICD-10-CM | POA: Insufficient documentation

## 2017-05-23 DIAGNOSIS — Z3A01 Less than 8 weeks gestation of pregnancy: Secondary | ICD-10-CM | POA: Diagnosis not present

## 2017-05-23 DIAGNOSIS — N911 Secondary amenorrhea: Secondary | ICD-10-CM | POA: Diagnosis not present

## 2017-05-23 LAB — COMPREHENSIVE METABOLIC PANEL
ALBUMIN: 4.4 g/dL (ref 3.5–5.0)
ALK PHOS: 52 U/L (ref 38–126)
ALT: 22 U/L (ref 14–54)
ANION GAP: 9 (ref 5–15)
AST: 26 U/L (ref 15–41)
BUN: 16 mg/dL (ref 6–20)
CO2: 23 mmol/L (ref 22–32)
Calcium: 9.4 mg/dL (ref 8.9–10.3)
Chloride: 103 mmol/L (ref 101–111)
Creatinine, Ser: 0.76 mg/dL (ref 0.44–1.00)
GFR calc Af Amer: >60 mL/min (ref >60)
GFR calc non Af Amer: >60 mL/min (ref >60)
GLUCOSE: 98 mg/dL (ref 65–99)
POTASSIUM: 4.1 mmol/L (ref 3.5–5.1)
SODIUM: 135 mmol/L (ref 135–145)
Total Bilirubin: 1.4 mg/dL — ABNORMAL HIGH (ref 0.3–1.2)
Total Protein: 7.6 g/dL (ref 6.5–8.1)

## 2017-05-23 LAB — CBC
HCT: 38.2 % (ref 36.0–46.0)
HEMOGLOBIN: 12.6 g/dL (ref 12.0–15.0)
MCH: 28.5 pg (ref 26.0–34.0)
MCHC: 33 g/dL (ref 30.0–36.0)
MCV: 86.4 fL (ref 78.0–100.0)
Platelets: 326 10*3/uL (ref 150–400)
RBC: 4.42 MIL/uL (ref 3.87–5.11)
RDW: 12.3 % (ref 11.5–15.5)
WBC: 7.7 10*3/uL (ref 4.0–10.5)

## 2017-05-23 MED ORDER — LACTATED RINGERS IV BOLUS
1000.0000 mL | Freq: Once | INTRAVENOUS | Status: AC
  Administered 2017-05-23: 1000 mL via INTRAVENOUS

## 2017-05-23 MED ORDER — SCOPOLAMINE 1 MG/3DAYS TD PT72: 1.0000 | MEDICATED_PATCH | TRANSDERMAL | 0 refills | Status: DC

## 2017-05-23 MED ORDER — M.V.I. ADULT IV INJ
Freq: Once | INTRAVENOUS | Status: AC
  Administered 2017-05-23: 13:00:00 via INTRAVENOUS
  Filled 2017-05-23: qty 10

## 2017-05-23 MED ORDER — PROMETHAZINE HCL 25 MG RE SUPP: 25.0000 mg | Freq: Four times a day (QID) | RECTAL | 0 refills | Status: DC | PRN

## 2017-05-23 MED ORDER — PROMETHAZINE HCL 25 MG PO TABS: 12.5000 mg | ORAL_TABLET | Freq: Four times a day (QID) | ORAL | 0 refills | Status: DC | PRN

## 2017-05-23 MED ORDER — SCOPOLAMINE 1 MG/3DAYS TD PT72
1.0000 | MEDICATED_PATCH | TRANSDERMAL | Status: DC
  Administered 2017-05-23: 1.5 mg via TRANSDERMAL
  Filled 2017-05-23: qty 1

## 2017-05-23 MED ORDER — PROMETHAZINE HCL 25 MG/ML IJ SOLN
25.0000 mg | Freq: Once | INTRAMUSCULAR | Status: AC
  Administered 2017-05-23: 25 mg via INTRAVENOUS
  Filled 2017-05-23: qty 1

## 2017-05-23 MED FILL — TRANSDERM-SCOP 1.5 MG/72HR: 1 | 30 days supply | Qty: 10 | Fill #0

## 2017-05-23 MED FILL — PROMETHAZINE 25 MG SUPP: 25 | 3 days supply | Qty: 12 | Fill #0

## 2017-05-23 MED FILL — PROMETHAZINE 25 MG TABLET: 25 | 3 days supply | Qty: 30 | Fill #0

## 2017-05-23 NOTE — MAU Note (Signed)
Patient sent from Dr Terri Piedra office for IVF and labs. Pt early pregnant at [redacted] weeks gestation, unable to keep down fluids for 4 days.

## 2017-05-23 NOTE — Discharge Instructions (Signed)
Morning Sickness °Morning sickness is when you feel sick to your stomach (nauseous) during pregnancy. This nauseous feeling may or may not come with vomiting. It often occurs in the morning but can be a problem any time of day. Morning sickness is most common during the first trimester, but it may continue throughout pregnancy. While morning sickness is unpleasant, it is usually harmless unless you develop severe and continual vomiting (hyperemesis gravidarum). This condition requires more intense treatment. °What are the causes? °The cause of morning sickness is not completely known but seems to be related to normal hormonal changes that occur in pregnancy. °What increases the risk? °You are at greater risk if you: °· Experienced nausea or vomiting before your pregnancy. °· Had morning sickness during a previous pregnancy. °· Are pregnant with more than one baby, such as twins. ° °How is this treated? °Do not use any medicines (prescription, over-the-counter, or herbal) for morning sickness without first talking to your health care provider. Your health care provider may prescribe or recommend: °· Vitamin B6 supplements. °· Anti-nausea medicines. °· The herbal medicine ginger. ° °Follow these instructions at home: °· Only take over-the-counter or prescription medicines as directed by your health care provider. °· Taking multivitamins before getting pregnant can prevent or decrease the severity of morning sickness in most women. °· Eat a piece of dry toast or unsalted crackers before getting out of bed in the morning. °· Eat five or six small meals a day. °· Eat dry and bland foods (rice, baked potato). Foods high in carbohydrates are often helpful. °· Do not drink liquids with your meals. Drink liquids between meals. °· Avoid greasy, fatty, and spicy foods. °· Get someone to cook for you if the smell of any food causes nausea and vomiting. °· If you feel nauseous after taking prenatal vitamins, take the vitamins at  night or with a snack. °· Snack on protein foods (nuts, yogurt, cheese) between meals if you are hungry. °· Eat unsweetened gelatins for desserts. °· Wearing an acupressure wristband (worn for sea sickness) may be helpful. °· Acupuncture may be helpful. °· Do not smoke. °· Get a humidifier to keep the air in your house free of odors. °· Get plenty of fresh air. °Contact a health care provider if: °· Your home remedies are not working, and you need medicine. °· You feel dizzy or lightheaded. °· You are losing weight. °Get help right away if: °· You have persistent and uncontrolled nausea and vomiting. °· You pass out (faint). °This information is not intended to replace advice given to you by your health care provider. Make sure you discuss any questions you have with your health care provider. °Document Released: 03/31/2006 Document Revised: 07/16/2015 Document Reviewed: 07/25/2012 °Elsevier Interactive Patient Education © 2017 [employer] ° ° °Eating Plan for Hyperemesis Gravidarum °Hyperemesis gravidarum is a severe form of morning sickness. Because this condition causes severe nausea and vomiting, it can lead to dehydration, malnutrition, and weight loss. One way to lessen the symptoms of nausea and vomiting is to follow the eating plan for hyperemesis gravidarum. It is often used along with prescribed medicines to control your symptoms. °What can I do to relieve my symptoms? °Listen to your body. Everyone is different and has different preferences. Find what works best for you. Take any of the following actions that are helpful to you: °· Eat and drink slowly. °· Eat 5-6 small meals daily instead of 3 large meals. °· Eat crackers before you get   out of bed in the morning. °· Try having a snack in the middle of the night. °· Starchy foods are usually tolerated well. Examples include cereal, toast, bread, potatoes, pasta, rice, and pretzels. °· Ginger may help with nausea. Add ¼ tsp ground ginger to hot tea or  choose ginger tea. °· Try drinking 100% fruit juice or an electrolyte drink. An electrolyte drink contains sodium, potassium, and chloride. °· Continue to take your prenatal vitamins as told by your health care provider. If you are having trouble taking your prenatal vitamins, talk with your health care provider about different options. °· Include at least 1 serving of protein with your meals and snacks. Protein options include meats or poultry, beans, nuts, eggs, and yogurt. Try eating a protein-rich snack before bed. Examples of these snacks include cheese and crackers or half of a peanut butter or turkey sandwich. °· Consider eliminating foods that trigger your symptoms. These may include spicy foods, coffee, high-fat foods, very sweet foods, and acidic foods. °· Try meals that have more protein combined with bland, salty, lower-fat, and dry foods, such as nuts, seeds, pretzels, crackers, and cereal. °· Talk with your healthcare provider about starting a supplement of vitamin B6. °· Have fluids that are cold, clear, and carbonated or sour. Examples include lemonade, ginger ale, lemon-lime soda, ice water, and sparkling water. °· Try lemon or mint tea. °· Try brushing your teeth or using a mouth rinse after meals. ° °What should I avoid to reduce my symptoms? °Avoiding some of the following things may help reduce your symptoms. °· Foods with strong smells. Try eating meals in well-ventilated areas that are free of odors. °· Drinking water or other beverages with meals. Try not to drink anything during the 30 minutes before and after your meals. °· Drinking more than 1 cup of fluid at a time. Sometimes using a straw helps. °· Fried or high-fat foods, such as butter and cream sauces. °· Spicy foods. °· Skipping meals as best as you can. Nausea can be more intense on an empty stomach. If you cannot tolerate food at that time, do not force it. Try sucking on ice chips or other frozen items, and make up for missed  calories later. °· Lying down within 2 hours after eating. °· Environmental triggers. These may include smoky rooms, closed spaces, rooms with strong smells, warm or humid places, overly loud and noisy rooms, and rooms with motion or flickering lights. °· Quick and sudden changes in your movement. ° °This information is not intended to replace advice given to you by your health care provider. Make sure you discuss any questions you have with your health care provider. °Document Released: 12/05/2006 Document Revised: 10/07/2015 Document Reviewed: 09/08/2015 °Elsevier Interactive Patient Education © 2018 [employer] Inc. ° °

## 2017-05-23 NOTE — MAU Note (Signed)
Pt sent from office for c/o N&V.  Reports been nauseated since Friday and vomiting since Monday.  Reports unable to keep anything down.

## 2017-05-23 NOTE — MAU Provider Note (Signed)
History     CSN: [id number]  Arrival date and time: 05/23/17 1212   First Provider Initiated Contact with Patient 05/23/17 1251      Chief Complaint  Patient presents with  . Nausea  . Emesis   G1 @[redacted]w[redacted]d  sent from office for N/V. Nausea started 4 days then vomiting started yesterday. She cannot tolerate anything po. No sick contacts. No diarrhea. No fevers. Denies abd pain or VB.    OB History    Gravida  1   Para      Term      Preterm      AB      Living        SAB      TAB      Ectopic      Multiple      Live Births              History reviewed. No pertinent past medical history.  History reviewed. No pertinent surgical history.  History reviewed. No pertinent family history.  Social History   Tobacco Use  . Smoking status: Never Smoker  . Smokeless tobacco: Never Used  Substance Use Topics  . Alcohol use: No  . Drug use: No    Allergies: No Known Allergies  Medications Prior to Admission  Medication Sig Dispense Refill Last Dose  . doxylamine, Sleep, (UNISOM) 25 MG tablet Take 25 mg by mouth at bedtime as needed (for nausea. pt took 50mg  05/22/17).    05/22/2017 at Unknown time  . Prenatal Vit-Fe Fumarate-FA (PRENATAL MULTIVITAMIN) TABS tablet Take 1 tablet by mouth daily at 12 noon.   05/22/2017 at Unknown time  . pyridoxine (B-6) 100 MG tablet Take 100 mg by mouth daily.   05/22/2017 at Unknown time  . diclofenac (VOLTAREN) 75 MG EC tablet Take 1 tablet (75 mg total) by mouth 2 (two) times daily. Take with food (Patient not taking: Reported on 05/23/2017) 30 tablet 0 Not Taking at Unknown time  . HYDROcodone-acetaminophen (NORCO/VICODIN) 5-325 MG tablet Take 2 tablets by mouth every 4 (four) hours as needed for moderate pain. (Patient not taking: Reported on 05/23/2017) 20 tablet 0 Not Taking at Unknown time  . metaxalone (SKELAXIN) 800 MG tablet Take 1 tablet (800 mg total) by mouth 3 (three) times daily. (Patient not taking: Reported on 05/23/2017) 21  tablet 0 Not Taking at Unknown time    Review of Systems  Constitutional: Negative for fever.  Gastrointestinal: Positive for nausea and vomiting. Negative for abdominal pain and diarrhea.  Genitourinary: Negative for vaginal bleeding.   Physical Exam   Blood pressure 106/76, pulse 87, temperature 98.5 F (36.9 C), temperature source Oral, resp. rate 18, height 5\' 4"  (1.626 m), weight 127 lb 4 oz (57.7 kg), SpO2 100 %.  Physical Exam  Nursing note and vitals reviewed. Constitutional: She is oriented to person, place, and time. She appears well-developed and well-nourished. No distress.  HENT:  Head: Normocephalic and atraumatic.  Neck: Normal range of motion.  Cardiovascular: Normal rate.  Respiratory: Effort normal. No respiratory distress.  Musculoskeletal: Normal range of motion.  Neurological: She is alert and oriented to person, place, and time.  Skin: Skin is warm and dry.  Psychiatric: She has a normal mood and affect.   Results for orders placed or performed during the hospital encounter of 05/23/17 (from the past 24 hour(s))  CBC     Status: None   Collection Time: 05/23/17 12:18 PM  Result Value Ref Range  WBC 7.7 4.0 - 10.5 K/uL   RBC 4.42 3.87 - 5.11 MIL/uL   Hemoglobin 12.6 12.0 - 15.0 g/dL   HCT 38.2 36.0 - 46.0 %   MCV 86.4 78.0 - 100.0 fL   MCH 28.5 26.0 - 34.0 pg   MCHC 33.0 30.0 - 36.0 g/dL   RDW 12.3 11.5 - 15.5 %   Platelets 326 150 - 400 K/uL  Comprehensive metabolic panel     Status: Abnormal   Collection Time: 05/23/17 12:18 PM  Result Value Ref Range   Sodium 135 135 - 145 mmol/L   Potassium 4.1 3.5 - 5.1 mmol/L   Chloride 103 101 - 111 mmol/L   CO2 23 22 - 32 mmol/L   Glucose, Bld 98 65 - 99 mg/dL   BUN 16 6 - 20 mg/dL   Creatinine, Ser 0.76 0.44 - 1.00 mg/dL   Calcium 9.4 8.9 - 10.3 mg/dL   Total Protein 7.6 6.5 - 8.1 g/dL   Albumin 4.4 3.5 - 5.0 g/dL   AST 26 15 - 41 U/L   ALT 22 14 - 54 U/L   Alkaline Phosphatase 52 38 - 126 U/L    Total Bilirubin 1.4 (H) 0.3 - 1.2 mg/dL   GFR calc non Af Amer >60 >60 mL/min   GFR calc Af Amer >60 >60 mL/min   Anion gap 9 5 - 15   MAU Course  Procedures LR MTV Phenergan Scopolamine patch  MDM Labs ordered and reviewed. Tolerating some po after fluids and meds. Presentation, clinical findings, and plan discussed with Dr. Terri Piedra. Stable for discharge home.  Assessment and Plan   1. [redacted] weeks gestation of pregnancy   2. Morning sickness    Discharge home Follow up in OB office as scheduled HEG diet Maintain hydration Rx Phenergan Rx Scopolamine Start Diclegis tonight Notify MD for worsening sx  Allergies as of 05/23/2017   No Known Allergies     Medication List    STOP taking these medications   diclofenac 75 MG EC tablet Commonly known as:  VOLTAREN   doxylamine (Sleep) 25 MG tablet Commonly known as:  UNISOM   HYDROcodone-acetaminophen 5-325 MG tablet Commonly known as:  NORCO/VICODIN   metaxalone 800 MG tablet Commonly known as:  SKELAXIN   pyridoxine 100 MG tablet Commonly known as:  B-6     TAKE these medications   prenatal multivitamin Tabs tablet Take 1 tablet by mouth daily at 12 noon.   promethazine 25 MG tablet Commonly known as:  PHENERGAN Take 0.5-1 tablets (12.5-25 mg total) by mouth every 6 (six) hours as needed for nausea or vomiting.   promethazine 25 MG suppository Commonly known as:  PHENERGAN Place 1 suppository (25 mg total) rectally every 6 (six) hours as needed for nausea.   scopolamine 1 MG/3DAYS Commonly known as:  TRANSDERM-SCOP Place 1 patch (1.5 mg total) onto the skin every 3 (three) days. Start taking on:  05/26/2017      Julianne Handler, CNM 05/23/2017, 4:14 PM

## 2017-05-23 NOTE — Progress Notes (Signed)
Discharge instructions, follow up appoint, and medications given and reviewed, pt and spouse verbalizes understanding.

## 2017-06-05 MED FILL — METOCLOPRAMIDE 10 MG TABLET: 10 | 30 days supply | Qty: 90 | Fill #0

## 2017-06-20 DIAGNOSIS — Z3A10 10 weeks gestation of pregnancy: Secondary | ICD-10-CM | POA: Diagnosis not present

## 2017-06-20 DIAGNOSIS — Z3689 Encounter for other specified antenatal screening: Secondary | ICD-10-CM | POA: Diagnosis not present

## 2017-06-20 DIAGNOSIS — O219 Vomiting of pregnancy, unspecified: Secondary | ICD-10-CM | POA: Diagnosis not present

## 2017-06-20 DIAGNOSIS — Z3682 Encounter for antenatal screening for nuchal translucency: Secondary | ICD-10-CM | POA: Diagnosis not present

## 2017-06-20 DIAGNOSIS — Z113 Encounter for screening for infections with a predominantly sexual mode of transmission: Secondary | ICD-10-CM | POA: Diagnosis not present

## 2017-06-20 DIAGNOSIS — O26891 Other specified pregnancy related conditions, first trimester: Secondary | ICD-10-CM | POA: Diagnosis not present

## 2017-06-20 DIAGNOSIS — Z368A Encounter for antenatal screening for other genetic defects: Secondary | ICD-10-CM | POA: Diagnosis not present

## 2017-06-20 LAB — OB RESULTS CONSOLE RUBELLA ANTIBODY, IGM: RUBELLA: IMMUNE

## 2017-06-20 LAB — OB RESULTS CONSOLE GC/CHLAMYDIA
CHLAMYDIA, DNA PROBE: NEGATIVE
Gonorrhea: NEGATIVE

## 2017-06-20 LAB — OB RESULTS CONSOLE ABO/RH: RH TYPE: POSITIVE

## 2017-06-20 LAB — OB RESULTS CONSOLE HIV ANTIBODY (ROUTINE TESTING): HIV: NONREACTIVE

## 2017-06-20 LAB — OB RESULTS CONSOLE HEPATITIS B SURFACE ANTIGEN: Hepatitis B Surface Ag: NEGATIVE

## 2017-06-20 LAB — OB RESULTS CONSOLE RPR: RPR: NONREACTIVE

## 2017-06-20 LAB — OB RESULTS CONSOLE ANTIBODY SCREEN: ANTIBODY SCREEN: NEGATIVE

## 2017-06-20 MED FILL — TRANSDERM-SCOP 1.5 MG/72HR: 1 | 30 days supply | Qty: 10 | Fill #0

## 2017-06-26 DIAGNOSIS — H52223 Regular astigmatism, bilateral: Secondary | ICD-10-CM | POA: Diagnosis not present

## 2017-07-05 DIAGNOSIS — Z3682 Encounter for antenatal screening for nuchal translucency: Secondary | ICD-10-CM | POA: Diagnosis not present

## 2017-07-05 DIAGNOSIS — O09511 Supervision of elderly primigravida, first trimester: Secondary | ICD-10-CM | POA: Diagnosis not present

## 2017-07-05 DIAGNOSIS — Z3A12 12 weeks gestation of pregnancy: Secondary | ICD-10-CM | POA: Diagnosis not present

## 2017-07-20 MED FILL — METOCLOPRAMIDE 10 MG TABLET: 10 | 30 days supply | Qty: 90 | Fill #1

## 2017-08-15 DIAGNOSIS — Z363 Encounter for antenatal screening for malformations: Secondary | ICD-10-CM | POA: Diagnosis not present

## 2017-08-15 DIAGNOSIS — Z3A18 18 weeks gestation of pregnancy: Secondary | ICD-10-CM | POA: Diagnosis not present

## 2017-09-28 MED FILL — AZITHROMYCIN 250 MG TABLET: 250 | 6 days supply | Qty: 6 | Fill #0

## 2017-10-17 DIAGNOSIS — Z3A27 27 weeks gestation of pregnancy: Secondary | ICD-10-CM | POA: Diagnosis not present

## 2017-10-17 DIAGNOSIS — Z3402 Encounter for supervision of normal first pregnancy, second trimester: Secondary | ICD-10-CM | POA: Diagnosis not present

## 2017-10-17 DIAGNOSIS — O219 Vomiting of pregnancy, unspecified: Secondary | ICD-10-CM | POA: Diagnosis not present

## 2017-10-17 DIAGNOSIS — Z3689 Encounter for other specified antenatal screening: Secondary | ICD-10-CM | POA: Diagnosis not present

## 2017-10-17 MED FILL — METOCLOPRAMIDE 10 MG TABLET: 10 | 30 days supply | Qty: 90 | Fill #0

## 2017-11-30 DIAGNOSIS — R82998 Other abnormal findings in urine: Secondary | ICD-10-CM | POA: Diagnosis not present

## 2017-12-21 DIAGNOSIS — Z3685 Encounter for antenatal screening for Streptococcus B: Secondary | ICD-10-CM | POA: Diagnosis not present

## 2017-12-21 DIAGNOSIS — Z3A37 37 weeks gestation of pregnancy: Secondary | ICD-10-CM | POA: Diagnosis not present

## 2017-12-21 LAB — OB RESULTS CONSOLE GBS: GBS: NEGATIVE

## 2018-01-02 ENCOUNTER — Telehealth (HOSPITAL_COMMUNITY): Payer: Self-pay | Admitting: *Deleted

## 2018-01-04 ENCOUNTER — Encounter (HOSPITAL_COMMUNITY): Payer: Self-pay | Admitting: *Deleted

## 2018-01-04 NOTE — Telephone Encounter (Signed)
Preadmission screen  

## 2018-01-13 ENCOUNTER — Inpatient Hospital Stay (HOSPITAL_COMMUNITY): Payer: 59 | Admitting: Anesthesiology

## 2018-01-13 ENCOUNTER — Encounter (HOSPITAL_COMMUNITY): Payer: Self-pay

## 2018-01-13 ENCOUNTER — Other Ambulatory Visit: Payer: Self-pay

## 2018-01-13 ENCOUNTER — Inpatient Hospital Stay (HOSPITAL_COMMUNITY)
Admission: AD | Admit: 2018-01-13 | Discharge: 2018-01-15 | DRG: 807 | Disposition: A | Discharge status: Home / Self Care | Payer: 59 | Attending: Obstetrics and Gynecology | Admitting: Obstetrics and Gynecology

## 2018-01-13 DIAGNOSIS — Z3A40 40 weeks gestation of pregnancy: Secondary | ICD-10-CM | POA: Diagnosis not present

## 2018-01-13 DIAGNOSIS — Z3483 Encounter for supervision of other normal pregnancy, third trimester: Secondary | ICD-10-CM | POA: Diagnosis present

## 2018-01-13 HISTORY — DX: Other specified health status: Z78.9

## 2018-01-13 LAB — TYPE AND SCREEN
ABO/RH(D): O POS
ANTIBODY SCREEN: NEGATIVE

## 2018-01-13 LAB — RPR: RPR: NONREACTIVE

## 2018-01-13 LAB — CBC
HCT: 38.1 % (ref 36.0–46.0)
Hemoglobin: 12.3 g/dL (ref 12.0–15.0)
MCH: 29.6 pg (ref 26.0–34.0)
MCHC: 32.3 g/dL (ref 30.0–36.0)
MCV: 91.8 fL (ref 80.0–100.0)
NRBC: 0 % (ref 0.0–0.2)
PLATELETS: 206 10*3/uL (ref 150–400)
RBC: 4.15 MIL/uL (ref 3.87–5.11)
RDW: 12.3 % (ref 11.5–15.5)
WBC: 14.5 10*3/uL — AB (ref 4.0–10.5)

## 2018-01-13 LAB — ABO/RH: ABO/RH(D): O POS

## 2018-01-13 MED ORDER — LACTATED RINGERS IV SOLN: 500.0000 mL | INTRAVENOUS | Status: DC | PRN

## 2018-01-13 MED ORDER — DIPHENHYDRAMINE HCL 50 MG/ML IJ SOLN: 12.5000 mg | INTRAMUSCULAR | Status: DC | PRN

## 2018-01-13 MED ORDER — LACTATED RINGERS IV SOLN
INTRAVENOUS | Status: DC
  Administered 2018-01-13: 08:00:00 via INTRAVENOUS

## 2018-01-13 MED ORDER — MEASLES, MUMPS & RUBELLA VAC IJ SOLR
0.5000 mL | Freq: Once | INTRAMUSCULAR | Status: DC
  Filled 2018-01-13: qty 0.5

## 2018-01-13 MED ORDER — ONDANSETRON HCL 4 MG PO TABS: 4.0000 mg | ORAL_TABLET | ORAL | Status: DC | PRN

## 2018-01-13 MED ORDER — WITCH HAZEL-GLYCERIN EX PADS
1.0000 "application " | MEDICATED_PAD | CUTANEOUS | Status: DC | PRN
  Administered 2018-01-13: 1 via TOPICAL

## 2018-01-13 MED ORDER — ACETAMINOPHEN 325 MG PO TABS: 650.0000 mg | ORAL_TABLET | ORAL | Status: DC | PRN

## 2018-01-13 MED ORDER — ONDANSETRON HCL 4 MG/2ML IJ SOLN: 4.0000 mg | Freq: Four times a day (QID) | INTRAMUSCULAR | Status: DC | PRN

## 2018-01-13 MED ORDER — PHENYLEPHRINE 40 MCG/ML (10ML) SYRINGE FOR IV PUSH (FOR BLOOD PRESSURE SUPPORT)
80.0000 ug | PREFILLED_SYRINGE | INTRAVENOUS | Status: DC | PRN
  Filled 2018-01-13: qty 10
  Filled 2018-01-13: qty 5

## 2018-01-13 MED ORDER — SENNOSIDES-DOCUSATE SODIUM 8.6-50 MG PO TABS
2.0000 | ORAL_TABLET | ORAL | Status: DC
  Administered 2018-01-13 – 2018-01-14 (×2): 2 via ORAL
  Filled 2018-01-13 (×2): qty 2

## 2018-01-13 MED ORDER — LACTATED RINGERS IV SOLN: 500.0000 mL | Freq: Once | INTRAVENOUS | Status: DC

## 2018-01-13 MED ORDER — OXYCODONE HCL 5 MG PO TABS: 10.0000 mg | ORAL_TABLET | ORAL | Status: DC | PRN

## 2018-01-13 MED ORDER — OXYTOCIN 40 UNITS IN LACTATED RINGERS INFUSION - SIMPLE MED
2.5000 [IU]/h | INTRAVENOUS | Status: DC
  Filled 2018-01-13: qty 1000

## 2018-01-13 MED ORDER — FENTANYL 2.5 MCG/ML BUPIVACAINE 1/10 % EPIDURAL INFUSION (WH - ANES)
14.0000 mL/h | INTRAMUSCULAR | Status: DC | PRN
  Administered 2018-01-13: 14 mL/h via EPIDURAL
  Filled 2018-01-13: qty 100

## 2018-01-13 MED ORDER — OXYTOCIN BOLUS FROM INFUSION
500.0000 mL | Freq: Once | INTRAVENOUS | Status: AC
  Administered 2018-01-13: 500 mL via INTRAVENOUS

## 2018-01-13 MED ORDER — ACETAMINOPHEN 325 MG PO TABS
650.0000 mg | ORAL_TABLET | ORAL | Status: DC | PRN
  Administered 2018-01-13 – 2018-01-14 (×3): 650 mg via ORAL
  Filled 2018-01-13 (×3): qty 2

## 2018-01-13 MED ORDER — TETANUS-DIPHTH-ACELL PERTUSSIS 5-2.5-18.5 LF-MCG/0.5 IM SUSP: 0.5000 mL | Freq: Once | INTRAMUSCULAR | Status: DC

## 2018-01-13 MED ORDER — PHENYLEPHRINE 40 MCG/ML (10ML) SYRINGE FOR IV PUSH (FOR BLOOD PRESSURE SUPPORT): 80.0000 ug | PREFILLED_SYRINGE | INTRAVENOUS | Status: DC | PRN

## 2018-01-13 MED ORDER — PRENATAL MULTIVITAMIN CH
1.0000 | ORAL_TABLET | Freq: Every day | ORAL | Status: DC
  Administered 2018-01-14 – 2018-01-15 (×2): 1 via ORAL
  Filled 2018-01-13 (×2): qty 1

## 2018-01-13 MED ORDER — SIMETHICONE 80 MG PO CHEW: 80.0000 mg | CHEWABLE_TABLET | ORAL | Status: DC | PRN

## 2018-01-13 MED ORDER — METHYLERGONOVINE MALEATE 0.2 MG/ML IJ SOLN: 0.2000 mg | INTRAMUSCULAR | Status: DC | PRN

## 2018-01-13 MED ORDER — SOD CITRATE-CITRIC ACID 500-334 MG/5ML PO SOLN: 30.0000 mL | ORAL | Status: DC | PRN

## 2018-01-13 MED ORDER — COCONUT OIL OIL: 1.0000 "application " | TOPICAL_OIL | Status: DC | PRN

## 2018-01-13 MED ORDER — MAGNESIUM HYDROXIDE 400 MG/5ML PO SUSP: 30.0000 mL | ORAL | Status: DC | PRN

## 2018-01-13 MED ORDER — EPHEDRINE 5 MG/ML INJ
10.0000 mg | INTRAVENOUS | Status: DC | PRN
  Filled 2018-01-13: qty 2

## 2018-01-13 MED ORDER — DIBUCAINE 1 % RE OINT
1.0000 "application " | TOPICAL_OINTMENT | RECTAL | Status: DC | PRN
  Administered 2018-01-13: 1 via RECTAL
  Filled 2018-01-13: qty 28

## 2018-01-13 MED ORDER — LIDOCAINE HCL (PF) 1 % IJ SOLN
INTRAMUSCULAR | Status: DC | PRN
  Administered 2018-01-13: 5 mL via EPIDURAL

## 2018-01-13 MED ORDER — ZOLPIDEM TARTRATE 5 MG PO TABS: 5.0000 mg | ORAL_TABLET | Freq: Every evening | ORAL | Status: DC | PRN

## 2018-01-13 MED ORDER — METHYLERGONOVINE MALEATE 0.2 MG PO TABS: 0.2000 mg | ORAL_TABLET | ORAL | Status: DC | PRN

## 2018-01-13 MED ORDER — LIDOCAINE HCL (PF) 1 % IJ SOLN
30.0000 mL | INTRAMUSCULAR | Status: DC | PRN
  Filled 2018-01-13: qty 30

## 2018-01-13 MED ORDER — ONDANSETRON HCL 4 MG/2ML IJ SOLN: 4.0000 mg | INTRAMUSCULAR | Status: DC | PRN

## 2018-01-13 MED ORDER — EPHEDRINE 5 MG/ML INJ: 10.0000 mg | INTRAVENOUS | Status: DC | PRN

## 2018-01-13 MED ORDER — OXYCODONE-ACETAMINOPHEN 5-325 MG PO TABS: 1.0000 | ORAL_TABLET | ORAL | Status: DC | PRN

## 2018-01-13 MED ORDER — PHENYLEPHRINE 40 MCG/ML (10ML) SYRINGE FOR IV PUSH (FOR BLOOD PRESSURE SUPPORT)
80.0000 ug | PREFILLED_SYRINGE | INTRAVENOUS | Status: DC | PRN
  Filled 2018-01-13: qty 5
  Filled 2018-01-13: qty 10

## 2018-01-13 MED ORDER — FLEET ENEMA 7-19 GM/118ML RE ENEM: 1.0000 | ENEMA | RECTAL | Status: DC | PRN

## 2018-01-13 MED ORDER — BENZOCAINE-MENTHOL 20-0.5 % EX AERO
1.0000 "application " | INHALATION_SPRAY | CUTANEOUS | Status: DC | PRN
  Administered 2018-01-13 – 2018-01-15 (×2): 1 via TOPICAL
  Filled 2018-01-13 (×2): qty 56

## 2018-01-13 MED ORDER — OXYCODONE-ACETAMINOPHEN 5-325 MG PO TABS: 2.0000 | ORAL_TABLET | ORAL | Status: DC | PRN

## 2018-01-13 MED ORDER — DIPHENHYDRAMINE HCL 25 MG PO CAPS: 25.0000 mg | ORAL_CAPSULE | Freq: Four times a day (QID) | ORAL | Status: DC | PRN

## 2018-01-13 MED ORDER — OXYCODONE HCL 5 MG PO TABS: 5.0000 mg | ORAL_TABLET | ORAL | Status: DC | PRN

## 2018-01-13 MED ORDER — IBUPROFEN 600 MG PO TABS
600.0000 mg | ORAL_TABLET | Freq: Four times a day (QID) | ORAL | Status: DC
  Administered 2018-01-13 – 2018-01-15 (×8): 600 mg via ORAL
  Filled 2018-01-13 (×8): qty 1

## 2018-01-13 NOTE — Anesthesia Procedure Notes (Signed)
Epidural Patient location during procedure: OB Start time: 01/13/2018 8:22 AM End time: 01/13/2018 8:34 AM  Staffing Anesthesiologist: Barnet Glasgow, MD Performed: anesthesiologist   Preanesthetic Checklist Completed: patient identified, site marked, surgical consent, pre-op evaluation, timeout performed, IV checked, risks and benefits discussed and monitors and equipment checked  Epidural Patient position: sitting Prep: site prepped and draped and DuraPrep Patient monitoring: continuous pulse ox and blood pressure Approach: midline Location: L2-L3 Injection technique: LOR air  Needle:  Needle type: Tuohy  Needle gauge: 17 G Needle length: 9 cm and 9 Needle insertion depth: 5 cm cm Catheter type: closed end flexible Catheter size: 19 Gauge Catheter at skin depth: 10 cm Test dose: negative  Assessment Events: blood not aspirated, injection not painful, no injection resistance, negative IV test and no paresthesia  Additional Notes 1 attempt. patient tolerated procedure well.

## 2018-01-13 NOTE — Anesthesia Postprocedure Evaluation (Signed)
Anesthesia Post Note  Patient: Michelle Wilkins  Procedure(s) Performed: AN AD [place]     Patient location during evaluation: Mother Baby Anesthesia Type: Epidural Level of consciousness: awake and alert, oriented and patient cooperative Pain management: pain level controlled Vital Signs Assessment: post-procedure vital signs reviewed and stable Respiratory status: spontaneous breathing Cardiovascular status: stable Postop Assessment: no headache, epidural receding, patient able to bend at knees and no signs of nausea or vomiting Anesthetic complications: no Comments: Pain score 0.    Last Vitals:  Vitals:   01/13/18 1400 01/13/18 1505  BP: 133/81 124/84  Pulse: (!) 106 74  Resp: 18 18  Temp: 36.9 C 36.9 C  SpO2: 100% 100%    Last Pain:  Vitals:   01/13/18 1505  TempSrc: Oral  PainSc: 0-No pain   Pain Goal:                 [facility]

## 2018-01-13 NOTE — MAU Note (Signed)
Pt states ctx started around 0530 today.  Pt denies LOF or vag bleeding.

## 2018-01-13 NOTE — H&P (Signed)
Michelle Wilkins is a 32 y.o. female, G1P0, EGA 40+ weeks with [facility] 11-21 presenting for ctx.  On eval in MAU, reg ctx, VE 5-6 cm.  Prenatal care essentially uncomplicated.  OB History    Gravida  1   Para      Term      Preterm      AB      Living        SAB      TAB      Ectopic      Multiple      Live Births             Past Medical History:  Diagnosis Date  . Hemochromatosis   . Medical history non-contributory    Past Surgical History:  Procedure Laterality Date  . NO PAST SURGERIES     Family History: family history is not on file. Social History:  reports that she has never smoked. She has never used smokeless tobacco. She reports that she does not drink alcohol or use drugs.     Maternal Diabetes: No Genetic Screening: Declined Maternal Ultrasounds/Referrals: Normal Fetal Ultrasounds or other Referrals:  None Maternal Substance Abuse:  No Significant Maternal Medications:  None Significant Maternal Lab Results:  Lab values include: Group B Strep negative Other Comments:  None  Review of Systems  Respiratory: Negative.   Cardiovascular: Negative.    Maternal Medical History:  Reason for admission: Contractions.   Contractions: Frequency: regular.   Perceived severity is strong.    Fetal activity: Perceived fetal activity is normal.    Prenatal complications: no prenatal complications Prenatal Complications - Diabetes: none.   VE-9/C/+1, vtx, AROM clear Dilation: 6 Effacement (%): 100 Station: -1 Exam by:: k fields, rn Blood pressure 115/82, pulse 90, temperature 97.7 F (36.5 C), temperature source Oral, resp. rate 18, height 5\' 4"  (1.626 m), weight 71.2 kg, SpO2 100 %. Maternal Exam:  Uterine Assessment: Contraction strength is moderate.  Contraction frequency is regular.   Abdomen: Patient reports no abdominal tenderness. Estimated fetal weight is 7 lbs.   Fetal presentation: vertex  Introitus: Normal vulva. Normal vagina.   Amniotic fluid character: clear.  Pelvis: adequate for delivery.   Cervix: Cervix evaluated by digital exam.     Fetal Exam Fetal Monitor Review: Mode: ultrasound.   Baseline rate: 120-130.  Variability: moderate (6-25 bpm).   Pattern: accelerations present, early decelerations and variable decelerations.    Fetal State Assessment: Category II - tracings are indeterminate.     Physical Exam  Vitals reviewed. Constitutional: She appears well-nourished.  Cardiovascular: Normal rate and regular rhythm.  Respiratory: Effort normal. No respiratory distress.  GI: Soft.    Prenatal labs: ABO, Rh: --/--/O POS (11/23 0757) Antibody: NEG (11/23 0757) Rubella: Immune (04/30 0000) RPR: Nonreactive (04/30 0000)  HBsAg: Negative (04/30 0000)  HIV: Non-reactive (04/30 0000)  GBS: Negative (10/31 0000)   Assessment/Plan: IUP at 40+ weeks in active labor, comfortable with epidural.  Monitor progress, anticipate SVD.  Blane Ohara Yago Ludvigsen 01/13/2018, 9:03 AM

## 2018-01-13 NOTE — Anesthesia Pain Management Evaluation Note (Signed)
  CRNA Pain Management Visit Note  Patient: Michelle Wilkins, 32 y.o., female  "Hello I am a member of the anesthesia team at [facility]. We have an anesthesia team available at all times to provide care throughout the hospital, including epidural management and anesthesia for C-section. I don't know your plan for the delivery whether it a natural birth, water birth, IV sedation, nitrous supplementation, doula or epidural, but we want to meet your pain goals."   1.Was your pain managed to your expectations on prior hospitalizations?   Yes   2.What is your expectation for pain management during this hospitalization?     Epidural  3.How can we help you reach that goal? epidural  Record the patient's initial score and the patient's pain goal.   Pain: 10/10  Pain Goal: 0/10 The [facility] wants you to be able to say your pain was always managed very well.  Ailene Ards 01/13/2018

## 2018-01-13 NOTE — Anesthesia Preprocedure Evaluation (Addendum)
Anesthesia Evaluation  Patient identified by MRN, date of birth, ID band Patient awake    Reviewed: Allergy & Precautions, NPO status , Patient's Chart, lab work & pertinent test results  Airway Mallampati: II  TM Distance: >3 FB Neck ROM: Full    Dental no notable dental hx. (+) Teeth Intact   Pulmonary neg pulmonary ROS,    Pulmonary exam normal breath sounds clear to auscultation       Cardiovascular negative cardio ROS Normal cardiovascular exam Rhythm:Regular Rate:Normal     Neuro/Psych negative neurological ROS  negative psych ROS   GI/Hepatic negative GI ROS, Neg liver ROS,   Endo/Other  negative endocrine ROS  Renal/GU negative Renal ROS     Musculoskeletal negative musculoskeletal ROS (+)   Abdominal   Peds  Hematology negative hematology ROS (+)   Anesthesia Other Findings   Reproductive/Obstetrics (+) Pregnancy                            Lab Results  Component Value Date   WBC 14.5 (H) 01/13/2018   HGB 12.3 01/13/2018   HCT 38.1 01/13/2018   MCV 91.8 01/13/2018   PLT 206 01/13/2018    Anesthesia Physical Anesthesia Plan  ASA: II  Anesthesia Plan: Epidural   Post-op Pain Management:    Induction:   PONV Risk Score and Plan:   Airway Management Planned:   Additional Equipment:   Intra-op Plan:   Post-operative Plan:   Informed Consent: I have reviewed the patients History and Physical, chart, labs and discussed the procedure including the risks, benefits and alternatives for the proposed anesthesia with the patient or authorized representative who has indicated his/her understanding and acceptance.     Plan Discussed with:   Anesthesia Plan Comments:         Anesthesia Quick Evaluation

## 2018-01-14 NOTE — Lactation Note (Signed)
This note was copied from a baby's chart. Lactation Consultation Note  Patient Name: Michelle Wilkins [id number] Date: 01/14/2018 Reason for consult: Initial assessment;Term P1, 12 hour female infant. BF concerns: Sore nipples with abrasions. Per parents,  infant had 2 stools and 1 void diaper. Per mom, she attended a BF class at [facility]. Mom is [employer] Employee has [employer] was given Medela DEBP (Freestyle). Per mom, having a little nipple sore has slight abrasions on both nipples. Mom has her own breast cream which does not contain Lanolin. Infant was cuing in basinet. Mom latched infant on right breast using the football hold, infant is good with opening mouth wide, LC asked mom bring infant close to breast " chin to breast" for deeper latch. Per mom, she could feel a difference in latch, she not feeling pinching only a tug for 3 seconds. Infant BF for 15 minutes and was  still breastfeeding  as [place] left room. Per mom, she likes the  football hold,  she only  been used the cradle and cross cradle hold. LC discussed I & O. Reviewed Baby & Me book's Breastfeeding Basics.  Mom made aware of O/P services, breastfeeding support groups, community resources, and our phone # for post-discharge questions.  BF plan: 1. Mom will BF according hunger cues, 8 to 12 times including nights.  2. Mom will wait until infant's mouth is wide and bring baby to breast with nose touch chin for latch. 3. Mom will continue to use cream and apply EBM for nipple soreness. 4. Mom will ask for BF  assistance if she has any questions or concerns from Nurse or [place]. Maternal Data Formula Feeding for Exclusion: No Has patient been taught Hand Expression?: Yes(Mom demostrated hand expression and colostrum present in both breast. ) Does the patient have breastfeeding experience prior to this delivery?: No  Feeding Feeding Type: Breast Fed  LATCH Score Latch: Grasps breast easily, tongue down, lips flanged,  rhythmical sucking.  Audible Swallowing: Spontaneous and intermittent  Type of Nipple: Everted at rest and after stimulation  Comfort (Breast/Nipple): Filling, red/small blisters or bruises, mild/mod discomfort  Hold (Positioning): Assistance needed to correctly position infant at breast and maintain latch.  LATCH Score: 8  Interventions Interventions: Breast feeding basics reviewed;Assisted with latch;Skin to skin;Breast compression;Adjust position;Support pillows;Position options;Hand express;Breast massage  Lactation Tools Discussed/Used WIC Program: No   Consult Status Consult Status: Follow-up Date: 01/15/18 Follow-up type: In-patient    Vicente Serene 01/14/2018, 12:49 AM

## 2018-01-14 NOTE — Lactation Note (Signed)
This note was copied from a baby's chart. Lactation Consultation Note  Patient Name: Michelle Wilkins [id number] Date: 01/14/2018 Reason for consult: Follow-up assessment  P1 mother whose infant is now 59 hours old.    Family had many visitors when I arrived.  Baby had recently returned from a circumcision and was sleeping.  Mother had no questions/concerns related to breast feeding other than stating that her nipples are a little bit sore.  Upon observation, her breasts are soft and non tender and nipples are intact with only slight redness.  I encouraged mother to have RN/LC observe next latch to be sure the baby is latching deeply.  Mother feels like the soreness eases as he feeds.  Encouraged to continue feeding 8-12 times/24 hours or sooner if he shows cues.  Suggested parents awaken him at 3 hours if he remains sleepy due to circumcision.  Mother will attempt to latch and if she needs assistance will call for help.  She is familiar with feeding cues and hand expression.  Colostrum container provided and milk storage times reviewed.    Father present and supportive.  Both parents very knowledgeable first time parents and receptive to learning.   Maternal Data Formula Feeding for Exclusion: No Has patient been taught Hand Expression?: Yes Does the patient have breastfeeding experience prior to this delivery?: No  Feeding    LATCH Score                   Interventions    Lactation Tools Discussed/Used WIC Program: No   Consult Status Consult Status: Follow-up Date: 01/15/18 Follow-up type: In-patient    Michelle Wilkins Michelle Wilkins 01/14/2018, 11:12 AM

## 2018-01-14 NOTE — Progress Notes (Signed)
PPD #1 No problems Afeb, VSS Fundus firm, NT at U-1 Continue routine postpartum care 

## 2018-01-15 ENCOUNTER — Inpatient Hospital Stay (HOSPITAL_COMMUNITY): Admission: RE | Admit: 2018-01-15 | Payer: 59 | Source: Ambulatory Visit

## 2018-01-15 MED ORDER — PRENATAL MULTIVITAMIN CH: 1.0000 | ORAL_TABLET | Freq: Every day | ORAL | 3 refills | Status: DC

## 2018-01-15 MED ORDER — IBUPROFEN 600 MG PO TABS: 600.0000 mg | ORAL_TABLET | Freq: Four times a day (QID) | ORAL | 1 refills | Status: DC

## 2018-01-15 NOTE — Progress Notes (Signed)
Post Partum Day 2 Subjective: no complaints, up ad lib, voiding, tolerating PO and nl lochia, pain controlled  Objective: Blood pressure 125/88, pulse 67, temperature 98 F (36.7 C), temperature source Oral, resp. rate 19, height 5\' 4"  (1.626 m), weight 71.2 kg, SpO2 98 %, unknown if currently breastfeeding.  Physical Exam:  General: alert and no distress Lochia: appropriate Uterine Fundus: firm  Recent Labs    01/13/18 0757  HGB 12.3  HCT 38.1    Assessment/Plan: Discharge home, Breastfeeding and Lactation consult.  D/C with Motrin and PNV.  F/u 2 weeks.     LOS: 2 days   Michelle Wilkins 01/15/2018, 7:45 AM

## 2018-01-15 NOTE — Lactation Note (Signed)
This note was copied from a baby's chart. Lactation Consultation Note  Patient Name: Michelle Wilkins [id number] Date: 01/15/2018 Reason for consult: Follow-up assessment   Baby 27 hours old.  Mother is sore. No cracks or abrasions. Helped with positioning to improve depth. Still noting some nipple compression and tongue cupping. Mom encouraged to feed baby 8-12 times/24 hours and with feeding cues.  For soreness suggest mother apply ebm or coconut oil while wearing shells and alternate with comfort gels. Reviewed engorgement care and monitoring voids/stools. Mom encouraged to feed baby 8-12 times/24 hours and with feeding cues.     Maternal Data    Feeding Feeding Type: Breast Fed  LATCH Score Latch: Grasps breast easily, tongue down, lips flanged, rhythmical sucking.  Audible Swallowing: A few with stimulation  Type of Nipple: Everted at rest and after stimulation  Comfort (Breast/Nipple): Filling, red/small blisters or bruises, mild/mod discomfort  Hold (Positioning): Assistance needed to correctly position infant at breast and maintain latch.  LATCH Score: 7  Interventions Interventions: Breast feeding basics reviewed;Comfort gels  Lactation Tools Discussed/Used     Consult Status Consult Status: Complete Date: 01/15/18    Vivianne Master [facility] 01/15/2018, 11:40 AM

## 2018-01-15 NOTE — Discharge Summary (Signed)
OB Discharge Summary     Patient Name: Michelle Wilkins DOB: April 18, 1985 MRN: [id number]  Date of admission: 01/13/2018 Delivering MD: Willis Modena, TODD   Date of discharge: 01/15/2018  Admitting diagnosis: 40wks 1 to 31mins apart  Intrauterine pregnancy: [redacted]w[redacted]d     Secondary diagnosis:  Active Problems:   Normal labor   SVD (spontaneous vaginal delivery)  Additional problems: N/A     Discharge diagnosis: Term Pregnancy Delivered                                                                                                Post partum procedures:N/A  Augmentation: N/A  Complications: None  Hospital course:  Onset of Labor With Vaginal Delivery     32 y.o. yo G1P1001 at [redacted]w[redacted]d was admitted in Active Labor on 01/13/2018. Patient had an uncomplicated labor course as follows:  Membrane Rupture Time/Date: 9:01 AM ,01/13/2018   Intrapartum Procedures: Episiotomy: Median [2]                                         Lacerations:  None [1]  Patient had a delivery of a Viable infant. 01/13/2018  Information for the patient's newborn:  Coley, Littles [[id number]]  Delivery Method: Vaginal, Spontaneous(Filed from Delivery Summary)    Pateint had an uncomplicated postpartum course.  She is ambulating, tolerating a regular diet, passing flatus, and urinating well. Patient is discharged home in stable condition on 01/15/18.   Physical exam  Vitals:   01/14/18 0500 01/14/18 1519 01/14/18 2243 01/15/18 0500  BP: 97/66 (!) 130/92 98/69 125/88  Pulse: 75 68 71 67  Resp:  19    Temp: 98.3 F (36.8 C) 98.2 F (36.8 C) 98.6 F (37 C) 98 F (36.7 C)  TempSrc: Oral Oral Oral Oral  SpO2:  100% 98%   Weight:      Height:       General: alert and no distress Lochia: appropriate Uterine Fundus: firm  Labs: Lab Results  Component Value Date   WBC 14.5 (H) 01/13/2018   HGB 12.3 01/13/2018   HCT 38.1 01/13/2018   MCV 91.8 01/13/2018   PLT 206 01/13/2018   CMP Latest Ref Rng &  Units 05/23/2017  Glucose 65 - 99 mg/dL 98  BUN 6 - 20 mg/dL 16  Creatinine 0.44 - 1.00 mg/dL 0.76  Sodium 135 - 145 mmol/L 135  Potassium 3.5 - 5.1 mmol/L 4.1  Chloride 101 - 111 mmol/L 103  CO2 22 - 32 mmol/L 23  Calcium 8.9 - 10.3 mg/dL 9.4  Total Protein 6.5 - 8.1 g/dL 7.6  Total Bilirubin 0.3 - 1.2 mg/dL 1.4(H)  Alkaline Phos 38 - 126 U/L 52  AST 15 - 41 U/L 26  ALT 14 - 54 U/L 22    Discharge instruction: per After Visit Summary and "Baby and Me Booklet".  After visit meds:  Allergies as of 01/15/2018   No Known Allergies     Medication List    TAKE these medications  acetaminophen 325 MG tablet Commonly known as:  TYLENOL Take 650 mg by mouth every 6 (six) hours as needed for headache.   calcium carbonate 500 MG chewable tablet Commonly known as:  TUMS - dosed in mg elemental calcium Chew 2-3 tablets by mouth 3 (three) times daily as needed for indigestion or heartburn.   ibuprofen 600 MG tablet Commonly known as:  ADVIL,MOTRIN Take 1 tablet (600 mg total) by mouth every 6 (six) hours.   metoCLOPramide 10 MG tablet Commonly known as:  REGLAN Take 10 mg by mouth at bedtime.   prenatal multivitamin Tabs tablet Take 1 tablet by mouth daily at 12 noon.       Diet: routine diet  Activity: Advance as tolerated. Pelvic rest for 6 weeks.   Outpatient follow up:6 weeks Follow up Appt:No future appointments. Follow up Visit:No follow-ups on file.  Postpartum contraception: Undecided  Newborn Data: Live born female  Birth Weight: 7 lb 9 oz (3430 g) APGAR: 2, 9  Newborn Delivery   Birth date/time:  01/13/2018 12:06:00 Delivery type:  Vaginal, Spontaneous     Baby Feeding: Breast Disposition:home with mother   01/15/2018 Janyth Contes, MD

## 2018-02-08 DIAGNOSIS — J111 Influenza due to unidentified influenza virus with other respiratory manifestations: Secondary | ICD-10-CM | POA: Diagnosis not present

## 2018-02-08 DIAGNOSIS — J0100 Acute maxillary sinusitis, unspecified: Secondary | ICD-10-CM | POA: Diagnosis not present

## 2018-02-23 DIAGNOSIS — Z30430 Encounter for insertion of intrauterine contraceptive device: Secondary | ICD-10-CM | POA: Diagnosis not present

## 2018-04-24 DIAGNOSIS — Z30431 Encounter for routine checking of intrauterine contraceptive device: Secondary | ICD-10-CM | POA: Diagnosis not present

## 2018-04-27 ENCOUNTER — Telehealth: Payer: 59 | Admitting: Family

## 2018-04-27 DIAGNOSIS — J029 Acute pharyngitis, unspecified: Secondary | ICD-10-CM | POA: Diagnosis not present

## 2018-04-27 DIAGNOSIS — R0981 Nasal congestion: Secondary | ICD-10-CM | POA: Diagnosis not present

## 2018-04-27 MED ORDER — FLUTICASONE PROPIONATE 50 MCG/ACT NA SUSP: 1.0000 | Freq: Two times a day (BID) | NASAL | 6 refills | Status: DC

## 2018-04-27 NOTE — Progress Notes (Signed)
Greater than 5 minutes, yet less than 10 minutes of time have been spent researching, coordinating, and implementing care for this patient today.  Thank you for the details you included in the comment boxes. Those details are very helpful in determining the best course of treatment for you and help [place] to provide the best care.  We are sorry that you are not feeling well.  Here is how we plan to help!  Your symptoms indicate a likely viral infection (Pharyngitis).   Pharyngitis is inflammation in the back of the throat which can cause a sore throat, scratchiness and sometimes difficulty swallowing.   Pharyngitis is typically caused by a respiratory virus and will just run its course.  Please keep in mind that your symptoms could last up to 10 days.  For throat pain, we recommend over the counter oral pain relief medications such as acetaminophen or aspirin, or anti-inflammatory medications such as ibuprofen or naproxen sodium.  Topical treatments such as oral throat lozenges or sprays may be used as needed.  Avoid close contact with loved ones, especially the very young and elderly.  Remember to wash your hands thoroughly throughout the day as this is the number one way to prevent the spread of infection and wipe down door knobs and counters with disinfectant.  Additionally, I have sent flonase (1 spray in each nare twice daily) to help with congestion.  After careful review of your answers, I would not recommend and antibiotic for your condition.  Antibiotics should not be used to treat conditions that we suspect are caused by viruses like the virus that causes the common cold or flu. However, some people can have Strep with atypical symptoms. You may need formal testing in clinic or office to confirm if your symptoms continue or worsen.  Providers prescribe antibiotics to treat infections caused by bacteria. Antibiotics are very powerful in treating bacterial infections when they are used properly.  To  maintain their effectiveness, they should be used only when necessary.  Overuse of antibiotics has resulted in the development of super bugs that are resistant to treatment!    Home Care:  Only take medications as instructed by your medical team.  Do not drink alcohol while taking these medications.  A steam or ultrasonic humidifier can help congestion.  You can place a towel over your head and breathe in the steam from hot water coming from a faucet.  Avoid close contacts especially the very young and the elderly.  Cover your mouth when you cough or sneeze.  Always remember to wash your hands.  Get Help Right Away If:  You develop worsening fever or throat pain.  You develop a severe head ache or visual changes.  Your symptoms persist after you have completed your treatment plan.  Make sure you  Understand these instructions.  Will watch your condition.  Will get help right away if you are not doing well or get worse.  Your e-visit answers were reviewed by a board certified advanced clinical practitioner to complete your personal care plan.  Depending on the condition, your plan could have included both over the counter or prescription medications.  If there is a problem please reply  once you have received a response from your provider.  Your safety is important to [place].  If you have drug allergies check your prescription carefully.    You can use MyChart to ask questions about todays visit, request a non-urgent call back, or ask for a work or  school excuse for 24 hours related to this e-Visit. If it has been greater than 24 hours you will need to follow up with your provider, or enter a new e-Visit to address those concerns.  You will get an e-mail in the next two days asking about your experience.  I hope that your e-visit has been valuable and will speed your recovery. Thank you for using e-visits.
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
## 2018-09-09 DIAGNOSIS — H52223 Regular astigmatism, bilateral: Secondary | ICD-10-CM | POA: Diagnosis not present

## 2018-09-11 DIAGNOSIS — R3914 Feeling of incomplete bladder emptying: Secondary | ICD-10-CM | POA: Diagnosis not present

## 2018-09-11 DIAGNOSIS — N941 Unspecified dyspareunia: Secondary | ICD-10-CM | POA: Diagnosis not present

## 2018-09-11 DIAGNOSIS — Z30431 Encounter for routine checking of intrauterine contraceptive device: Secondary | ICD-10-CM | POA: Diagnosis not present

## 2018-09-12 DIAGNOSIS — R102 Pelvic and perineal pain: Secondary | ICD-10-CM | POA: Diagnosis not present

## 2018-09-25 DIAGNOSIS — N941 Unspecified dyspareunia: Secondary | ICD-10-CM | POA: Diagnosis not present

## 2018-09-25 DIAGNOSIS — M6281 Muscle weakness (generalized): Secondary | ICD-10-CM | POA: Diagnosis not present

## 2018-09-25 DIAGNOSIS — M62838 Other muscle spasm: Secondary | ICD-10-CM | POA: Diagnosis not present

## 2018-09-25 DIAGNOSIS — R3914 Feeling of incomplete bladder emptying: Secondary | ICD-10-CM | POA: Diagnosis not present

## 2018-10-02 DIAGNOSIS — R338 Other retention of urine: Secondary | ICD-10-CM | POA: Diagnosis not present

## 2018-10-02 DIAGNOSIS — M6289 Other specified disorders of muscle: Secondary | ICD-10-CM | POA: Diagnosis not present

## 2018-10-02 DIAGNOSIS — M6281 Muscle weakness (generalized): Secondary | ICD-10-CM | POA: Diagnosis not present

## 2018-10-02 DIAGNOSIS — R3914 Feeling of incomplete bladder emptying: Secondary | ICD-10-CM | POA: Diagnosis not present

## 2018-10-02 DIAGNOSIS — N941 Unspecified dyspareunia: Secondary | ICD-10-CM | POA: Diagnosis not present

## 2018-10-02 DIAGNOSIS — M62838 Other muscle spasm: Secondary | ICD-10-CM | POA: Diagnosis not present

## 2018-10-12 DIAGNOSIS — M6281 Muscle weakness (generalized): Secondary | ICD-10-CM | POA: Diagnosis not present

## 2018-10-12 DIAGNOSIS — N941 Unspecified dyspareunia: Secondary | ICD-10-CM | POA: Diagnosis not present

## 2018-10-12 DIAGNOSIS — M62838 Other muscle spasm: Secondary | ICD-10-CM | POA: Diagnosis not present

## 2018-10-12 DIAGNOSIS — M6289 Other specified disorders of muscle: Secondary | ICD-10-CM | POA: Diagnosis not present

## 2018-10-12 DIAGNOSIS — R338 Other retention of urine: Secondary | ICD-10-CM | POA: Diagnosis not present

## 2018-10-12 DIAGNOSIS — R3914 Feeling of incomplete bladder emptying: Secondary | ICD-10-CM | POA: Diagnosis not present

## 2018-11-19 DIAGNOSIS — M6281 Muscle weakness (generalized): Secondary | ICD-10-CM | POA: Diagnosis not present

## 2018-11-19 DIAGNOSIS — N941 Unspecified dyspareunia: Secondary | ICD-10-CM | POA: Diagnosis not present

## 2018-11-19 DIAGNOSIS — R3914 Feeling of incomplete bladder emptying: Secondary | ICD-10-CM | POA: Diagnosis not present

## 2018-11-19 DIAGNOSIS — M62838 Other muscle spasm: Secondary | ICD-10-CM | POA: Diagnosis not present

## 2018-11-19 DIAGNOSIS — M6289 Other specified disorders of muscle: Secondary | ICD-10-CM | POA: Diagnosis not present

## 2018-12-10 DIAGNOSIS — M62838 Other muscle spasm: Secondary | ICD-10-CM | POA: Diagnosis not present

## 2018-12-10 DIAGNOSIS — M6289 Other specified disorders of muscle: Secondary | ICD-10-CM | POA: Diagnosis not present

## 2018-12-10 DIAGNOSIS — R3914 Feeling of incomplete bladder emptying: Secondary | ICD-10-CM | POA: Diagnosis not present

## 2018-12-10 DIAGNOSIS — N941 Unspecified dyspareunia: Secondary | ICD-10-CM | POA: Diagnosis not present

## 2018-12-10 DIAGNOSIS — M6281 Muscle weakness (generalized): Secondary | ICD-10-CM | POA: Diagnosis not present

## 2019-01-08 DIAGNOSIS — N941 Unspecified dyspareunia: Secondary | ICD-10-CM | POA: Diagnosis not present

## 2019-01-08 DIAGNOSIS — M62838 Other muscle spasm: Secondary | ICD-10-CM | POA: Diagnosis not present

## 2019-01-08 DIAGNOSIS — R3914 Feeling of incomplete bladder emptying: Secondary | ICD-10-CM | POA: Diagnosis not present

## 2019-01-08 DIAGNOSIS — M6289 Other specified disorders of muscle: Secondary | ICD-10-CM | POA: Diagnosis not present

## 2019-01-08 DIAGNOSIS — M6281 Muscle weakness (generalized): Secondary | ICD-10-CM | POA: Diagnosis not present

## 2019-01-09 ENCOUNTER — Telehealth: Payer: 59 | Admitting: Nurse Practitioner

## 2019-01-09 DIAGNOSIS — H1031 Unspecified acute conjunctivitis, right eye: Secondary | ICD-10-CM

## 2019-01-09 MED ORDER — POLYMYXIN B-TRIMETHOPRIM 10000-0.1 UNIT/ML-% OP SOLN: 2.0000 [drp] | OPHTHALMIC | 0 refills | Status: DC

## 2019-01-09 MED FILL — POLYMYXIN B/TMP EYE DROPS: 10000-0.1 | 9 days supply | Qty: 10 | Fill #0

## 2019-01-09 NOTE — Progress Notes (Signed)
E-Visit for Pink Eye   We are sorry that you are not feeling well.  Here is how we plan to help!  Based on what you have shared with me it looks like you have conjunctivitis.  Conjunctivitis is a common inflammatory or infectious condition of the eye that is often referred to as "pink eye".  In most cases it is contagious (viral or bacterial). However, not all conjunctivitis requires antibiotics (ex. Allergic).  We have made appropriate suggestions for you based upon your presentation.  I have prescribed Polytrim Ophthalmic drops 1-2 drops 4 times a day times 5 days  Pink eye can be highly contagious.  It is typically spread through direct contact with secretions, or contaminated objects or surfaces that one may have touched.  Strict handwashing is suggested with soap and water is urged.  If not available, use alcohol based had sanitizer.  Avoid unnecessary touching of the eye.  If you wear contact lenses, you will need to refrain from wearing them until you see no white discharge from the eye for at least 24 hours after being on medication.  You should see symptom improvement in 1-2 days after starting the medication regimen.  Call us if symptoms are not improved in 1-2 days.  Home Care:  Wash your hands often!  Do not wear your contacts until you complete your treatment plan.  Avoid sharing towels, bed linen, personal items with a person who has pink eye.  See attention for anyone in your home with similar symptoms.  Get Help Right Away If:  Your symptoms do not improve.  You develop blurred or loss of vision.  Your symptoms worsen (increased discharge, pain or redness)  Your e-visit answers were reviewed by a board certified advanced clinical practitioner to complete your personal care plan.  Depending on the condition, your plan could have included both over the counter or prescription medications.  If there is a problem please reply  once you have received a response from your  provider.  Your safety is important to us.  If you have drug allergies check your prescription carefully.    You can use MyChart to ask questions about today's visit, request a non-urgent call back, or ask for a work or school excuse for 24 hours related to this e-Visit. If it has been greater than 24 hours you will need to follow up with your provider, or enter a new e-Visit to address those concerns.   You will get an e-mail in the next two days asking about your experience.  I hope that your e-visit has been valuable and will speed your recovery. Thank you for using e-visits.   5-10 minutes spent reviewing and documenting in chart.    

## 2019-02-20 DIAGNOSIS — L814 Other melanin hyperpigmentation: Secondary | ICD-10-CM | POA: Diagnosis not present

## 2019-02-20 DIAGNOSIS — D225 Melanocytic nevi of trunk: Secondary | ICD-10-CM | POA: Diagnosis not present

## 2019-03-18 DIAGNOSIS — Z0000 Encounter for general adult medical examination without abnormal findings: Secondary | ICD-10-CM | POA: Diagnosis not present

## 2019-03-18 DIAGNOSIS — Z13220 Encounter for screening for lipoid disorders: Secondary | ICD-10-CM | POA: Diagnosis not present

## 2019-03-18 DIAGNOSIS — F418 Other specified anxiety disorders: Secondary | ICD-10-CM | POA: Diagnosis not present

## 2019-04-26 DIAGNOSIS — Z30431 Encounter for routine checking of intrauterine contraceptive device: Secondary | ICD-10-CM | POA: Diagnosis not present

## 2019-04-26 DIAGNOSIS — Z01419 Encounter for gynecological examination (general) (routine) without abnormal findings: Secondary | ICD-10-CM | POA: Diagnosis not present

## 2019-04-26 DIAGNOSIS — Z6822 Body mass index (BMI) 22.0-22.9, adult: Secondary | ICD-10-CM | POA: Diagnosis not present

## 2019-04-26 DIAGNOSIS — Z1389 Encounter for screening for other disorder: Secondary | ICD-10-CM | POA: Diagnosis not present

## 2019-09-10 DIAGNOSIS — H52223 Regular astigmatism, bilateral: Secondary | ICD-10-CM | POA: Diagnosis not present

## 2020-01-20 DIAGNOSIS — J3489 Other specified disorders of nose and nasal sinuses: Secondary | ICD-10-CM | POA: Diagnosis not present

## 2020-01-20 DIAGNOSIS — R051 Acute cough: Secondary | ICD-10-CM | POA: Diagnosis not present

## 2020-01-20 DIAGNOSIS — Z20828 Contact with and (suspected) exposure to other viral communicable diseases: Secondary | ICD-10-CM | POA: Diagnosis not present

## 2020-01-22 DIAGNOSIS — R051 Acute cough: Secondary | ICD-10-CM | POA: Diagnosis not present

## 2020-01-22 DIAGNOSIS — Z20828 Contact with and (suspected) exposure to other viral communicable diseases: Secondary | ICD-10-CM | POA: Diagnosis not present

## 2020-02-13 DIAGNOSIS — Z20828 Contact with and (suspected) exposure to other viral communicable diseases: Secondary | ICD-10-CM | POA: Diagnosis not present

## 2020-02-21 DIAGNOSIS — R059 Cough, unspecified: Secondary | ICD-10-CM | POA: Diagnosis not present

## 2020-02-21 DIAGNOSIS — Z20828 Contact with and (suspected) exposure to other viral communicable diseases: Secondary | ICD-10-CM | POA: Diagnosis not present

## 2020-03-05 ENCOUNTER — Other Ambulatory Visit (HOSPITAL_COMMUNITY): Payer: Self-pay | Admitting: Physician Assistant

## 2020-03-05 DIAGNOSIS — M940 Chondrocostal junction syndrome [Tietze]: Secondary | ICD-10-CM | POA: Diagnosis not present

## 2020-03-05 MED FILL — predniSONE 20 MG TABS: 20 | 7 days supply | Qty: 15 | Fill #0

## 2020-03-27 DIAGNOSIS — D485 Neoplasm of uncertain behavior of skin: Secondary | ICD-10-CM | POA: Diagnosis not present

## 2020-03-27 DIAGNOSIS — L814 Other melanin hyperpigmentation: Secondary | ICD-10-CM | POA: Diagnosis not present

## 2020-03-27 DIAGNOSIS — D225 Melanocytic nevi of trunk: Secondary | ICD-10-CM | POA: Diagnosis not present

## 2020-03-31 DIAGNOSIS — Z0000 Encounter for general adult medical examination without abnormal findings: Secondary | ICD-10-CM | POA: Diagnosis not present

## 2020-03-31 DIAGNOSIS — F418 Other specified anxiety disorders: Secondary | ICD-10-CM | POA: Diagnosis not present

## 2020-03-31 DIAGNOSIS — Z13220 Encounter for screening for lipoid disorders: Secondary | ICD-10-CM | POA: Diagnosis not present

## 2020-04-28 ENCOUNTER — Other Ambulatory Visit (HOSPITAL_COMMUNITY): Payer: Self-pay | Admitting: Obstetrics and Gynecology

## 2020-04-28 DIAGNOSIS — Z1389 Encounter for screening for other disorder: Secondary | ICD-10-CM | POA: Diagnosis not present

## 2020-04-28 DIAGNOSIS — Z30432 Encounter for removal of intrauterine contraceptive device: Secondary | ICD-10-CM | POA: Diagnosis not present

## 2020-04-28 DIAGNOSIS — Z3009 Encounter for other general counseling and advice on contraception: Secondary | ICD-10-CM | POA: Diagnosis not present

## 2020-04-28 DIAGNOSIS — Z6822 Body mass index (BMI) 22.0-22.9, adult: Secondary | ICD-10-CM | POA: Diagnosis not present

## 2020-04-28 DIAGNOSIS — Z01419 Encounter for gynecological examination (general) (routine) without abnormal findings: Secondary | ICD-10-CM | POA: Diagnosis not present

## 2020-04-28 MED FILL — LARISSIA 0.1-20 MG-MCG TABS: 0.1-20 | 84 days supply | Qty: 84 | Fill #0

## 2020-09-30 DIAGNOSIS — H52223 Regular astigmatism, bilateral: Secondary | ICD-10-CM | POA: Diagnosis not present

## 2021-01-17 DIAGNOSIS — J3489 Other specified disorders of nose and nasal sinuses: Secondary | ICD-10-CM | POA: Diagnosis not present

## 2021-01-17 DIAGNOSIS — R059 Cough, unspecified: Secondary | ICD-10-CM | POA: Diagnosis not present

## 2021-01-17 DIAGNOSIS — J069 Acute upper respiratory infection, unspecified: Secondary | ICD-10-CM | POA: Diagnosis not present

## 2021-01-21 ENCOUNTER — Telehealth: Payer: 59 | Admitting: Emergency Medicine

## 2021-01-21 ENCOUNTER — Other Ambulatory Visit (HOSPITAL_COMMUNITY): Payer: Self-pay

## 2021-01-21 DIAGNOSIS — B9689 Other specified bacterial agents as the cause of diseases classified elsewhere: Secondary | ICD-10-CM | POA: Diagnosis not present

## 2021-01-21 DIAGNOSIS — J019 Acute sinusitis, unspecified: Secondary | ICD-10-CM | POA: Diagnosis not present

## 2021-01-21 MED ORDER — AMOXICILLIN-POT CLAVULANATE 875-125 MG PO TABS
1.0000 | ORAL_TABLET | Freq: Two times a day (BID) | ORAL | 0 refills | Status: DC
  Filled 2021-01-21: qty 14, 7d supply, fill #0

## 2021-01-21 NOTE — Progress Notes (Signed)

## 2021-02-02 ENCOUNTER — Telehealth: Payer: 59 | Admitting: Family Medicine

## 2021-02-02 DIAGNOSIS — R197 Diarrhea, unspecified: Secondary | ICD-10-CM | POA: Diagnosis not present

## 2021-02-02 NOTE — Progress Notes (Signed)
We are sorry that you are not feeling well.  Here is how we plan to help!  Based on what you have shared with me it looks like you have Acute Infectious Diarrhea.  Most cases of acute diarrhea are due to infections with virus and bacteria and are self-limited conditions lasting less than 14 days.  For your symptoms you may take Imodium 2 mg tablets that are over the counter at your local pharmacy. Take two tablet now and then one after each loose stool up to 6 a day.  Antibiotics are not needed for most people with diarrhea.   HOME CARE We recommend changing your diet to help with your symptoms for the next few days. Drink plenty of fluids that contain water salt and sugar. Sports drinks such as Gatorade may help.  You may try broths, soups, bananas, applesauce, soft breads, mashed potatoes or crackers.  You are considered infectious for as long as the diarrhea continues. Hand washing or use of alcohol based hand sanitizers is recommend. It is best to stay out of work or school until your symptoms stop.   GET HELP RIGHT AWAY If you have dark yellow colored urine or do not pass urine frequently you should drink more fluids.   If your symptoms worsen  If you feel like you are going to pass out (faint) You have a new problem  MAKE SURE YOU  Understand these instructions. Will watch your condition. Will get help right away if you are not doing well or get worse.  Thank you for choosing an e-visit.  Your e-visit answers were reviewed by a board certified advanced clinical practitioner to complete your personal care plan. Depending upon the condition, your plan could have included both over the counter or prescription medications.  Please review your pharmacy choice. Make sure the pharmacy is open so you can pick up prescription now. If there is a problem, you may contact your provider through MyChart messaging and have the prescription routed to another pharmacy.  Your safety is important  to us. If you have drug allergies check your prescription carefully.   For the next 24 hours you can use MyChart to ask questions about today's visit, request a non-urgent call back, or ask for a work or school excuse. You will get an email in the next two days asking about your experience. I hope that your e-visit has been valuable and will speed your recovery.  I provided 5 minutes of non face-to-face time during this encounter for chart review, medication and order placement, as well as and documentation.   

## 2021-02-21 NOTE — L&D Delivery Note (Signed)
Delivery Note Following amniotomy, patient progressed rapidly to 10 cm.  She pushed well for < 10 minutes.  At 6:39 AM a viable female was delivered via Vaginal, Spontaneous (Presentation: Left Occiput Anterior).  APGAR: 9, 9; weight 2950 gm (6lb 8oz) .   Placenta status: Spontaneous, Intact.  Cord: 3 vessels with the following complications: None.  Cord pH: n/a  Anesthesia: Epidural Episiotomy: None Lacerations: 2nd degree Suture Repair: 2.0 vicryl rapide Est. Blood Loss (mL):  125 mL  Mom to postpartum.  Baby to Couplet care / Skin to Skin.  [place] 11/19/2021, 7:50 AM

## 2021-02-25 ENCOUNTER — Telehealth: Payer: 59 | Admitting: Physician Assistant

## 2021-02-25 DIAGNOSIS — H5710 Ocular pain, unspecified eye: Secondary | ICD-10-CM

## 2021-02-25 NOTE — Progress Notes (Signed)
Based on what you shared with me, I feel your condition warrants further evaluation and I recommend that you be seen in a face to face visit.  Severe eye pain is not a common finding with run-of-the-mill conjunctivitis and is more concerning for a more significant or deeper infection. As such you need to be evaluated in person for an eye examination to be cautious, and to make sure the most appropriate treatment is given.    NOTE: There will be NO CHARGE for this eVisit   If you are having a true medical emergency please call 911.      For an urgent face to face visit, [place] has six urgent care centers for your convenience:     [facility] [place] [facility] [place] Get Driving Directions [phone] [address] [place], [place] [address]    [facility]) Get Driving Directions [phone] [place], [place] [address]  [facility] [place] [facility]) Get Driving Directions [phone] [address] [place],  [place]  [address]  [facility] Get Driving Directions [phone] [address], [place], [place] [address]   [facility] Get Driving Directions  [phone] [address] [place], [place] [address]   [facility] [place] Get Driving Directions [phone] [address], [place],  [address]  Your MyChart E-visit questionnaire answers were reviewed by a board certified advanced clinical practitioner to complete your personal care plan based on your specific symptoms.  Thank you for using e-Visits.

## 2021-03-16 ENCOUNTER — Other Ambulatory Visit (HOSPITAL_COMMUNITY): Payer: Self-pay

## 2021-03-16 MED ORDER — METOCLOPRAMIDE HCL 10 MG PO TABS
10.0000 mg | ORAL_TABLET | Freq: Three times a day (TID) | ORAL | 1 refills | Status: DC | PRN
  Filled 2021-03-16: qty 45, 15d supply, fill #0
  Filled 2021-04-26: qty 45, 15d supply, fill #1

## 2021-03-29 DIAGNOSIS — L821 Other seborrheic keratosis: Secondary | ICD-10-CM | POA: Diagnosis not present

## 2021-03-29 DIAGNOSIS — D225 Melanocytic nevi of trunk: Secondary | ICD-10-CM | POA: Diagnosis not present

## 2021-03-29 DIAGNOSIS — L814 Other melanin hyperpigmentation: Secondary | ICD-10-CM | POA: Diagnosis not present

## 2021-03-30 DIAGNOSIS — Z3201 Encounter for pregnancy test, result positive: Secondary | ICD-10-CM | POA: Diagnosis not present

## 2021-03-30 DIAGNOSIS — N911 Secondary amenorrhea: Secondary | ICD-10-CM | POA: Diagnosis not present

## 2021-04-07 ENCOUNTER — Other Ambulatory Visit (HOSPITAL_COMMUNITY): Payer: Self-pay

## 2021-04-07 MED ORDER — ONDANSETRON 4 MG PO TBDP
4.0000 mg | ORAL_TABLET | Freq: Three times a day (TID) | ORAL | 1 refills | Status: DC | PRN
  Filled 2021-04-07: qty 20, 7d supply, fill #0

## 2021-04-19 ENCOUNTER — Inpatient Hospital Stay (HOSPITAL_COMMUNITY)
Admission: AD | Admit: 2021-04-19 | Discharge: 2021-04-19 | Disposition: A | Discharge status: Home / Self Care | Payer: 59 | Attending: Obstetrics | Admitting: Obstetrics

## 2021-04-19 ENCOUNTER — Other Ambulatory Visit (HOSPITAL_COMMUNITY): Payer: Self-pay

## 2021-04-19 ENCOUNTER — Encounter (HOSPITAL_COMMUNITY): Payer: Self-pay | Admitting: *Deleted

## 2021-04-19 DIAGNOSIS — O210 Mild hyperemesis gravidarum: Secondary | ICD-10-CM | POA: Diagnosis not present

## 2021-04-19 DIAGNOSIS — Z3A09 9 weeks gestation of pregnancy: Secondary | ICD-10-CM | POA: Insufficient documentation

## 2021-04-19 DIAGNOSIS — R1110 Vomiting, unspecified: Secondary | ICD-10-CM

## 2021-04-19 DIAGNOSIS — Z79899 Other long term (current) drug therapy: Secondary | ICD-10-CM | POA: Diagnosis not present

## 2021-04-19 LAB — COMPREHENSIVE METABOLIC PANEL
ALT: 17 U/L (ref 0–44)
AST: 18 U/L (ref 15–41)
Albumin: 3.6 g/dL (ref 3.5–5.0)
Alkaline Phosphatase: 39 U/L (ref 38–126)
Anion gap: 8 (ref 5–15)
BUN: 14 mg/dL (ref 6–20)
CO2: 25 mmol/L (ref 22–32)
Calcium: 9.2 mg/dL (ref 8.9–10.3)
Chloride: 104 mmol/L (ref 98–111)
Creatinine, Ser: 0.7 mg/dL (ref 0.44–1.00)
GFR, Estimated: >60 mL/min (ref >60)
Glucose, Bld: 90 mg/dL (ref 70–99)
Potassium: 3.3 mmol/L — ABNORMAL LOW (ref 3.5–5.1)
Sodium: 137 mmol/L (ref 135–145)
Total Bilirubin: 0.6 mg/dL (ref 0.3–1.2)
Total Protein: 6.6 g/dL (ref 6.5–8.1)

## 2021-04-19 LAB — URINALYSIS, ROUTINE W REFLEX MICROSCOPIC
Bilirubin Urine: NEGATIVE
Glucose, UA: NEGATIVE mg/dL
Hgb urine dipstick: NEGATIVE
Ketones, ur: 20 mg/dL — AB
Leukocytes,Ua: NEGATIVE
Nitrite: NEGATIVE
Protein, ur: NEGATIVE mg/dL
Specific Gravity, Urine: 1.027 (ref 1.005–1.030)
pH: 6 (ref 5.0–8.0)

## 2021-04-19 LAB — CBC
HCT: 35.1 % — ABNORMAL LOW (ref 36.0–46.0)
Hemoglobin: 11.3 g/dL — ABNORMAL LOW (ref 12.0–15.0)
MCH: 27.6 pg (ref 26.0–34.0)
MCHC: 32.2 g/dL (ref 30.0–36.0)
MCV: 85.8 fL (ref 80.0–100.0)
Platelets: 297 10*3/uL (ref 150–400)
RBC: 4.09 MIL/uL (ref 3.87–5.11)
RDW: 12 % (ref 11.5–15.5)
WBC: 8.1 10*3/uL (ref 4.0–10.5)
nRBC: 0 % (ref 0.0–0.2)

## 2021-04-19 LAB — TSH: TSH: 0.259 u[IU]/mL — ABNORMAL LOW (ref 0.350–4.500)

## 2021-04-19 MED ORDER — GLYCOPYRROLATE 0.2 MG/ML IJ SOLN
0.1000 mg | Freq: Once | INTRAMUSCULAR | Status: AC
  Administered 2021-04-19: 0.1 mg via INTRAVENOUS
  Filled 2021-04-19: qty 1

## 2021-04-19 MED ORDER — SODIUM CHLORIDE 0.9 % IV SOLN
8.0000 mg | Freq: Once | INTRAVENOUS | Status: AC
  Administered 2021-04-19: 8 mg via INTRAVENOUS
  Filled 2021-04-19: qty 4

## 2021-04-19 MED ORDER — ONDANSETRON 4 MG PO TBDP
4.0000 mg | ORAL_TABLET | Freq: Four times a day (QID) | ORAL | 3 refills | Status: DC | PRN
  Filled 2021-04-19: qty 30, 8d supply, fill #0

## 2021-04-19 MED ORDER — ONDANSETRON 4 MG PO TBDP: 4.0000 mg | ORAL_TABLET | Freq: Four times a day (QID) | ORAL | 3 refills | Status: DC | PRN

## 2021-04-19 MED ORDER — LACTATED RINGERS IV BOLUS
1000.0000 mL | Freq: Once | INTRAVENOUS | Status: AC
  Administered 2021-04-19: 1000 mL via INTRAVENOUS

## 2021-04-19 MED ORDER — GLYCOPYRROLATE 2 MG PO TABS: 2.0000 mg | ORAL_TABLET | Freq: Three times a day (TID) | ORAL | 3 refills | Status: DC | PRN

## 2021-04-19 MED ORDER — GLYCOPYRROLATE 2 MG PO TABS
2.0000 mg | ORAL_TABLET | Freq: Three times a day (TID) | ORAL | 3 refills | Status: DC | PRN
  Filled 2021-04-19: qty 30, 10d supply, fill #0

## 2021-04-19 NOTE — MAU Provider Note (Signed)
History     CSN: [id number]  Arrival date and time: 04/19/21 1324   Event Date/Time   First Provider Initiated Contact with Patient 04/19/21 1407       Chief Complaint  Patient presents with   Emesis   HPI This is a 36 yo G2P1001 at [redacted]w[redacted]d who presents with increasing nausea, vomiting.  She had hyperemesis gravidarum in her first pregnancy and was on antiemetics for the first trimester of her last pregnancy.  For this pregnancy she has been on Reglan for a week, Zofran for a week, and Phenergan for this past week.  For the first day or 2, the new anti-emetics work, then she feels that they stop working. She hasn't kept anything down for the past 3-4 days.  No fevers, chills, abdominal pain.  OB History     Gravida  2   Para  1   Term  1   Preterm      AB      Living  1      SAB      IAB      Ectopic      Multiple  0   Live Births  1           Past Medical History:  Diagnosis Date   Hemochromatosis    Medical history non-contributory     Past Surgical History:  Procedure Laterality Date   NO PAST SURGERIES      History reviewed. No pertinent family history.  Social History   Tobacco Use   Smoking status: Never   Smokeless tobacco: Never  Vaping Use   Vaping Use: Never used  Substance Use Topics   Alcohol use: No   Drug use: No    Allergies: No Known Allergies  Medications Prior to Admission  Medication Sig Dispense Refill Last Dose   promethazine (PHENERGAN) 25 MG suppository Place 25 mg rectally every 6 (six) hours as needed for nausea or vomiting.   04/19/2021 at 0700   acetaminophen (TYLENOL) 325 MG tablet Take 650 mg by mouth every 6 (six) hours as needed for headache.      amoxicillin-clavulanate (AUGMENTIN) 875-125 MG tablet Take 1 tablet by mouth 2 (two) times daily. 14 tablet 0    calcium carbonate (TUMS - DOSED IN MG ELEMENTAL CALCIUM) 500 MG chewable tablet Chew 2-3 tablets by mouth 3 (three) times daily as needed for indigestion  or heartburn.      fluticasone (FLONASE) 50 MCG/ACT nasal spray Place 1 spray into both nostrils 2 (two) times daily. 16 g 6    ibuprofen (ADVIL,MOTRIN) 600 MG tablet Take 1 tablet (600 mg total) by mouth every 6 (six) hours. 45 tablet 1    levonorgestrel-ethinyl estradiol (ALESSE) 0.1-20 MG-MCG tablet TAKE 1 TABLET BY MOUTH EVERY DAY 84 tablet 5    metoCLOPramide (REGLAN) 10 MG tablet Take 10 mg by mouth at bedtime.      metoCLOPramide (REGLAN) 10 MG tablet Take 1 tablet (10 mg total) by mouth 3 (three) times daily as needed. 45 tablet 1    ondansetron (ZOFRAN-ODT) 4 MG disintegrating tablet Place 1 tablet (4 mg total) on tongue every 8 (eight) hours as needed. 20 tablet 1    Prenatal Vit-Fe Fumarate-FA (PRENATAL MULTIVITAMIN) TABS tablet Take 1 tablet by mouth daily at 12 noon. 100 tablet 3    trimethoprim-polymyxin b (POLYTRIM) ophthalmic solution Place 2 drops into both eyes every 4 (four) hours. 10 mL 0     Review of  Systems Physical Exam   Blood pressure 107/77, pulse (!) 105, temperature 98.1 F (36.7 C), resp. rate 18, height 5\' 4"  (1.626 m), weight 58.1 kg, last menstrual period 02/15/2021, unknown if currently breastfeeding.  Physical Exam Vitals reviewed.  Constitutional:      Appearance: Normal appearance.  HENT:     Head: Normocephalic and atraumatic.     Right Ear: Tympanic membrane normal.     Left Ear: Tympanic membrane normal.  Cardiovascular:     Rate and Rhythm: Normal rate and regular rhythm.  Abdominal:     General: Abdomen is flat. There is no distension.     Tenderness: There is no abdominal tenderness. There is no guarding or rebound.  Skin:    General: Skin is warm.     Capillary Refill: Capillary refill takes less than 2 seconds.  Neurological:     General: No focal deficit present.     Mental Status: She is alert.  Psychiatric:        Mood and Affect: Mood normal.        Behavior: Behavior normal.        Thought Content: Thought content normal.         Judgment: Judgment normal.   Results for orders placed or performed during the hospital encounter of 04/19/21 (from the past 24 hour(s))  Comprehensive metabolic panel     Status: Abnormal   Collection Time: 04/19/21  2:17 PM  Result Value Ref Range   Sodium 137 135 - 145 mmol/L   Potassium 3.3 (L) 3.5 - 5.1 mmol/L   Chloride 104 98 - 111 mmol/L   CO2 25 22 - 32 mmol/L   Glucose, Bld 90 70 - 99 mg/dL   BUN 14 6 - 20 mg/dL   Creatinine, Ser 0.70 0.44 - 1.00 mg/dL   Calcium 9.2 8.9 - 10.3 mg/dL   Total Protein 6.6 6.5 - 8.1 g/dL   Albumin 3.6 3.5 - 5.0 g/dL   AST 18 15 - 41 U/L   ALT 17 0 - 44 U/L   Alkaline Phosphatase 39 38 - 126 U/L   Total Bilirubin 0.6 0.3 - 1.2 mg/dL   GFR, Estimated >60 >60 mL/min   Anion gap 8 5 - 15  CBC     Status: Abnormal   Collection Time: 04/19/21  2:17 PM  Result Value Ref Range   WBC 8.1 4.0 - 10.5 K/uL   RBC 4.09 3.87 - 5.11 MIL/uL   Hemoglobin 11.3 (L) 12.0 - 15.0 g/dL   HCT 35.1 (L) 36.0 - 46.0 %   MCV 85.8 80.0 - 100.0 fL   MCH 27.6 26.0 - 34.0 pg   MCHC 32.2 30.0 - 36.0 g/dL   RDW 12.0 11.5 - 15.5 %   Platelets 297 150 - 400 K/uL   nRBC 0.0 0.0 - 0.2 %  TSH     Status: Abnormal   Collection Time: 04/19/21  2:17 PM  Result Value Ref Range   TSH 0.259 (L) 0.350 - 4.500 uIU/mL  Urinalysis, Routine w reflex microscopic Urine, Clean Catch     Status: Abnormal   Collection Time: 04/19/21  2:54 PM  Result Value Ref Range   Color, Urine AMBER (A) YELLOW   APPearance HAZY (A) CLEAR   Specific Gravity, Urine 1.027 1.005 - 1.030   pH 6.0 5.0 - 8.0   Glucose, UA NEGATIVE NEGATIVE mg/dL   Hgb urine dipstick NEGATIVE NEGATIVE   Bilirubin Urine NEGATIVE NEGATIVE   Ketones, ur  20 (A) NEGATIVE mg/dL   Protein, ur NEGATIVE NEGATIVE mg/dL   Nitrite NEGATIVE NEGATIVE   Leukocytes,Ua NEGATIVE NEGATIVE     MAU Course  Procedures  MDM Check CBC, CMP, TSH. Will give zofran 8mg  and robinul 0.1mg    Assessment and Plan   1. [redacted] weeks gestation  of pregnancy   2. Hyperemesis    TSH slightly low because of hyperemesis Patient improved and tolerated oral fluids. We will send the patient home with Robinul and Zofran. We will have the patient cycle through medications F/u with primary OB.  Truett Mainland 04/19/2021, 2:06 PM

## 2021-04-19 NOTE — MAU Note (Signed)
Pt reports she has had N/V on and off x 3 weeks. Has taken , Reglan, Zofran And phenergan without much relief. Took phenergan at 7am.

## 2021-04-22 DIAGNOSIS — Z1151 Encounter for screening for human papillomavirus (HPV): Secondary | ICD-10-CM | POA: Diagnosis not present

## 2021-04-22 DIAGNOSIS — Z124 Encounter for screening for malignant neoplasm of cervix: Secondary | ICD-10-CM | POA: Diagnosis not present

## 2021-04-22 DIAGNOSIS — Z0142 Encounter for cervical smear to confirm findings of recent normal smear following initial abnormal smear: Secondary | ICD-10-CM | POA: Diagnosis not present

## 2021-04-22 DIAGNOSIS — Z1272 Encounter for screening for malignant neoplasm of vagina: Secondary | ICD-10-CM | POA: Diagnosis not present

## 2021-04-26 ENCOUNTER — Other Ambulatory Visit (HOSPITAL_COMMUNITY): Payer: Self-pay

## 2021-04-26 DIAGNOSIS — Z3A10 10 weeks gestation of pregnancy: Secondary | ICD-10-CM | POA: Diagnosis not present

## 2021-04-26 DIAGNOSIS — Z1272 Encounter for screening for malignant neoplasm of vagina: Secondary | ICD-10-CM | POA: Diagnosis not present

## 2021-04-26 DIAGNOSIS — Z113 Encounter for screening for infections with a predominantly sexual mode of transmission: Secondary | ICD-10-CM | POA: Diagnosis not present

## 2021-04-26 DIAGNOSIS — Z369 Encounter for antenatal screening, unspecified: Secondary | ICD-10-CM | POA: Diagnosis not present

## 2021-04-26 DIAGNOSIS — O09522 Supervision of elderly multigravida, second trimester: Secondary | ICD-10-CM | POA: Diagnosis not present

## 2021-04-26 DIAGNOSIS — O26891 Other specified pregnancy related conditions, first trimester: Secondary | ICD-10-CM | POA: Diagnosis not present

## 2021-04-26 DIAGNOSIS — Z124 Encounter for screening for malignant neoplasm of cervix: Secondary | ICD-10-CM | POA: Diagnosis not present

## 2021-04-26 DIAGNOSIS — Z0142 Encounter for cervical smear to confirm findings of recent normal smear following initial abnormal smear: Secondary | ICD-10-CM | POA: Diagnosis not present

## 2021-04-26 DIAGNOSIS — O09521 Supervision of elderly multigravida, first trimester: Secondary | ICD-10-CM | POA: Diagnosis not present

## 2021-04-26 LAB — OB RESULTS CONSOLE RPR: RPR: NONREACTIVE

## 2021-04-26 LAB — OB RESULTS CONSOLE GC/CHLAMYDIA
Chlamydia: NEGATIVE
Neisseria Gonorrhea: NEGATIVE

## 2021-04-26 LAB — OB RESULTS CONSOLE RUBELLA ANTIBODY, IGM: Rubella: IMMUNE

## 2021-04-26 LAB — OB RESULTS CONSOLE ANTIBODY SCREEN: Antibody Screen: NEGATIVE

## 2021-04-26 LAB — OB RESULTS CONSOLE HIV ANTIBODY (ROUTINE TESTING): HIV: NONREACTIVE

## 2021-04-26 LAB — HEPATITIS C ANTIBODY: HCV Ab: NEGATIVE

## 2021-04-26 LAB — OB RESULTS CONSOLE HEPATITIS B SURFACE ANTIGEN: Hepatitis B Surface Ag: NEGATIVE

## 2021-04-28 ENCOUNTER — Other Ambulatory Visit (HOSPITAL_COMMUNITY): Payer: Self-pay

## 2021-04-28 MED ORDER — ONDANSETRON 4 MG PO TBDP
4.0000 mg | ORAL_TABLET | ORAL | 1 refills | Status: DC
  Filled 2021-04-28: qty 20, 7d supply, fill #0
  Filled 2021-05-27 – 2021-08-21 (×2): qty 20, 7d supply, fill #1
  Filled 2021-08-28: qty 20, 7d supply, fill #0

## 2021-05-03 ENCOUNTER — Telehealth: Payer: Self-pay | Admitting: Hematology and Oncology

## 2021-05-03 NOTE — Telephone Encounter (Signed)
Scheduled appt per 3/9 referral. Pt is aware of appt date and time. Pt is aware to arrive 15 mins prior to appt time and to bring and updated insurance card. Pt is aware of appt location.   ?

## 2021-05-04 ENCOUNTER — Other Ambulatory Visit (HOSPITAL_COMMUNITY): Payer: Self-pay

## 2021-05-04 MED ORDER — PROMETHAZINE HCL 25 MG PO TABS
25.0000 mg | ORAL_TABLET | Freq: Three times a day (TID) | ORAL | 1 refills | Status: DC | PRN
  Filled 2021-05-04: qty 30, 10d supply, fill #0
  Filled 2021-06-09: qty 30, 10d supply, fill #1

## 2021-05-04 MED ORDER — ONDANSETRON 4 MG PO TBDP
4.0000 mg | ORAL_TABLET | Freq: Three times a day (TID) | ORAL | 1 refills | Status: DC | PRN
  Filled 2021-05-04: qty 45, 15d supply, fill #0
  Filled 2021-05-17: qty 45, 15d supply, fill #1

## 2021-05-10 ENCOUNTER — Other Ambulatory Visit (HOSPITAL_COMMUNITY): Payer: Self-pay

## 2021-05-10 MED ORDER — METOCLOPRAMIDE HCL 10 MG PO TABS
10.0000 mg | ORAL_TABLET | Freq: Three times a day (TID) | ORAL | 1 refills | Status: DC | PRN
  Filled 2021-05-10: qty 45, 15d supply, fill #0
  Filled 2021-05-26: qty 45, 15d supply, fill #1

## 2021-05-11 DIAGNOSIS — Z3A10 10 weeks gestation of pregnancy: Secondary | ICD-10-CM | POA: Diagnosis not present

## 2021-05-11 DIAGNOSIS — O09522 Supervision of elderly multigravida, second trimester: Secondary | ICD-10-CM | POA: Diagnosis not present

## 2021-05-13 NOTE — Progress Notes (Signed)
[place] ?CONSULT NOTE ? ?Patient Care Team: ?Lois Huxley, PA as PCP - General (Family Medicine) ? ?CHIEF COMPLAINTS/PURPOSE OF CONSULTATION:  ?Hemochromatosis ? ?HISTORY OF PRESENTING ILLNESS:  ?Michelle Wilkins 36 y.o. female is here because of recent diagnosis of hemochromatosis. She presents to the clinic today for consult and labs.  She is currently 3 months pregnant was referred to [place] because of a known history of hemochromatosis.  She was diagnosed with hemochromatosis about 10 years ago and has been monitored by her primary care physicians in [place].  She was instructed to donate blood and after a few blood donations [facility] decided not to take her blood because of hemochromatosis.  She has not had any blood donation in a while.  She is currently 3 months pregnant and OB/GYN requested a consultation with [place] to ensure that there are no issues or complications arising from her condition. ?We do not have a copy of the hemochromatosis gene testing that she did 10 years ago but it is very possible that she may be a compound heterozygote since she does not have the severe hemochromatosis clinical appearance.  Although some of these symptoms could have been prevented because of her monthly menstrual cycles. ? ?I reviewed her records extensively and collaborated the history with the patient. ? ?MEDICAL HISTORY:  ?Past Medical History:  ?Diagnosis Date  ? Hemochromatosis   ? Medical history non-contributory   ? ? ?SURGICAL HISTORY: ?Past Surgical History:  ?Procedure Laterality Date  ? NO PAST SURGERIES    ? ? ?SOCIAL HISTORY: ?Social History  ? ?Socioeconomic History  ? Marital status: Married  ?  Spouse name: Not on file  ? Number of children: Not on file  ? Years of education: Not on file  ? Highest education level: Not on file  ?Occupational History  ? Not on file  ?Tobacco Use  ? Smoking status: Never  ? Smokeless tobacco: Never  ?Vaping Use  ? Vaping Use: Never used  ?Substance and Sexual  Activity  ? Alcohol use: No  ? Drug use: No  ? Sexual activity: Yes  ?  Birth control/protection: None  ?  Comment: pregnant  ?Other Topics Concern  ? Not on file  ?Social History Narrative  ? Not on file  ? ?Social Determinants of Health  ? ?Financial Resource Strain: Not on file  ?Food Insecurity: Not on file  ?Transportation Needs: Not on file  ?Physical Activity: Not on file  ?Stress: Not on file  ?Social Connections: Not on file  ?Intimate Partner Violence: Not on file  ? ? ?FAMILY HISTORY: ?No family history on file. ? ?ALLERGIES:  has No Known Allergies. ? ?MEDICATIONS:  ?Current Outpatient Medications  ?Medication Sig Dispense Refill  ? acetaminophen (TYLENOL) 325 MG tablet Take 650 mg by mouth every 6 (six) hours as needed for headache.    ? fluticasone (FLONASE) 50 MCG/ACT nasal spray Place 1 spray into both nostrils 2 (two) times daily. 16 g 6  ? glycopyrrolate (ROBINUL) 2 MG tablet Take 1 tablet (2 mg total) by mouth 3 (three) times daily as needed. 30 tablet 3  ? metoCLOPramide (REGLAN) 10 MG tablet Take 1 tablet (10 mg total) by mouth 3 (three) times daily as needed. 45 tablet 1  ? ondansetron (ZOFRAN-ODT) 4 MG disintegrating tablet Take 1 tablet (4 mg total) by mouth every 6 (six) hours as needed for nausea or vomiting. 30 tablet 3  ? ondansetron (ZOFRAN-ODT) 4 MG disintegrating tablet Place 1  tablet (4 mg total) on tongue every 8 (eight) hours as needed. 20 tablet 1  ? ondansetron (ZOFRAN-ODT) 4 MG disintegrating tablet Dissolve 1 tablet (4 mg total) on the tongue every 8 (eight) hours as needed. 45 tablet 1  ? Prenatal Vit-Fe Fumarate-FA (PRENATAL MULTIVITAMIN) TABS tablet Take 1 tablet by mouth daily at 12 noon. 100 tablet 3  ? promethazine (PHENERGAN) 25 MG tablet Take 1 tablet (25 mg total) by mouth every 8 (eight) hours as needed. 30 tablet 1  ? ?No current facility-administered medications for this visit.  ? ? ?REVIEW OF SYSTEMS:   ?  ?All other systems were reviewed with the patient and are  negative. ? ?PHYSICAL EXAMINATION: ?ECOG PERFORMANCE STATUS: 1 - Symptomatic but completely ambulatory ? ?Vitals:  ? 05/14/21 1551  ?BP: 108/66  ?Pulse: 95  ?Resp: 17  ?Temp: 97.7 ?F (36.5 ?C)  ?SpO2: 98%  ? ?Filed Weights  ? 05/14/21 1551  ?Weight: 138 lb 3.2 oz (62.7 kg)  ? ?  ? ?LABORATORY DATA:  ?I have reviewed the data as listed ?Lab Results  ?Component Value Date  ? WBC 8.1 04/19/2021  ? HGB 11.3 (L) 04/19/2021  ? HCT 35.1 (L) 04/19/2021  ? MCV 85.8 04/19/2021  ? PLT 297 04/19/2021  ? ?Lab Results  ?Component Value Date  ? NA 137 04/19/2021  ? K 3.3 (L) 04/19/2021  ? CL 104 04/19/2021  ? CO2 25 04/19/2021  ? ? ?RADIOGRAPHIC STUDIES: ?I have personally reviewed the radiological reports and agreed with the findings in the report. ? ?ASSESSMENT AND PLAN:  ?Hemochromatosis ?Current pregnancy ?Lab review ?04/26/2021: Hemoglobin electrophoresis: Normal hemoglobin, hemoglobin 11, MCV 86, platelets 338, iron saturation: 59%, ferritin 374, TIBC 299 ?10 years ago she had hemochromatosis gene testing and was apparently diagnosed with hereditary hemochromatosis but she has not required any phlebotomies.  Initially she did donate blood but over time [facility] refused to take her blood. ? ?Counseling: I discussed with the patient the entire mechanism of hemochromatosis.  We discussed the clinical symptoms of hemochromatosis including elevation of liver function tests as well as skin hyperpigmentation, diabetes, arthralgias and sometimes EKG abnormalities. ?The severity of the symptoms depending on whether the patient is homozygous or heterozygous.  She will try to obtain a copy of that blood work and bring it back to [place] next time she comes. ? ?Diagnosis: Hemochromatosis gene testing ?Goal: Ferritin level below 100 ?However because the patient is pregnant and is likely to have some hemodilution and her requirements for iron will increase, we decided not to do any phlebotomies until she delivers. ?At the time of delivery,  hemochromatosis will not cause any additional issues.  If anything it would help provide her with additional iron that she might need in case there was blood loss during delivery. ?I discussed with her that she likely will may not have any clinical issues until she goes into menopause and after that she may have to go through periodic phlebotomy treatments. ? ?We will check her iron studies and ferritin in 3 months and telephone visit after that to discuss results ? ? ?All questions were answered. The patient knows to call the clinic with any problems, questions or concerns. ?  ? Harriette Ohara, MD ?05/14/21 ?  ?I Gardiner Coins am scribing for Dr. Lindi Adie ? ?I have reviewed the above documentation for accuracy and completeness, and I agree with the above. ?  ?

## 2021-05-14 ENCOUNTER — Other Ambulatory Visit: Payer: Self-pay

## 2021-05-14 ENCOUNTER — Inpatient Hospital Stay: Payer: 59 | Attending: Hematology and Oncology | Admitting: Hematology and Oncology

## 2021-05-14 ENCOUNTER — Inpatient Hospital Stay: Payer: 59

## 2021-05-14 DIAGNOSIS — O99280 Endocrine, nutritional and metabolic diseases complicating pregnancy, unspecified trimester: Secondary | ICD-10-CM | POA: Insufficient documentation

## 2021-05-14 DIAGNOSIS — Z79899 Other long term (current) drug therapy: Secondary | ICD-10-CM | POA: Insufficient documentation

## 2021-05-14 DIAGNOSIS — Z3A00 Weeks of gestation of pregnancy not specified: Secondary | ICD-10-CM | POA: Insufficient documentation

## 2021-05-14 NOTE — Assessment & Plan Note (Addendum)
Current pregnancy ?Lab review ?04/26/2021: Hemoglobin electrophoresis: Normal hemoglobin, hemoglobin 11, MCV 86, platelets 338, iron saturation: 59%, ferritin 374, TIBC 299 ? ?Counseling: I discussed with the patient the entire mechanism of hemochromatosis.  We discussed the clinical symptoms of hemochromatosis including elevation of liver function tests as well as skin hyperpigmentation, diabetes, arthralgias and sometimes EKG abnormalities. ?The severity of the symptoms depending on whether the patient is homozygous or heterozygous. ? ?Diagnosis: Hemochromatosis gene testing ?Goal: Ferritin level below 100 ? ?Telephone visit in 2 weeks to discuss results ? ?

## 2021-05-17 ENCOUNTER — Other Ambulatory Visit (HOSPITAL_COMMUNITY): Payer: Self-pay

## 2021-05-20 DIAGNOSIS — Z0000 Encounter for general adult medical examination without abnormal findings: Secondary | ICD-10-CM | POA: Diagnosis not present

## 2021-05-20 DIAGNOSIS — F411 Generalized anxiety disorder: Secondary | ICD-10-CM | POA: Diagnosis not present

## 2021-05-21 DIAGNOSIS — R8290 Unspecified abnormal findings in urine: Secondary | ICD-10-CM | POA: Diagnosis not present

## 2021-05-26 ENCOUNTER — Other Ambulatory Visit (HOSPITAL_COMMUNITY): Payer: Self-pay

## 2021-05-26 MED ORDER — ONDANSETRON 4 MG PO TBDP
4.0000 mg | ORAL_TABLET | Freq: Three times a day (TID) | ORAL | 1 refills | Status: DC | PRN
  Filled 2021-05-26 – 2021-05-27 (×3): qty 45, 15d supply, fill #0
  Filled 2021-06-11: qty 45, 15d supply, fill #1

## 2021-05-26 MED ORDER — SERTRALINE HCL 25 MG PO TABS
25.0000 mg | ORAL_TABLET | Freq: Every evening | ORAL | 0 refills | Status: DC
  Filled 2021-05-26: qty 30, 30d supply, fill #0

## 2021-05-27 ENCOUNTER — Other Ambulatory Visit (HOSPITAL_COMMUNITY): Payer: Self-pay

## 2021-06-10 ENCOUNTER — Other Ambulatory Visit (HOSPITAL_COMMUNITY): Payer: Self-pay

## 2021-06-11 ENCOUNTER — Other Ambulatory Visit (HOSPITAL_COMMUNITY): Payer: Self-pay

## 2021-06-11 MED ORDER — METOCLOPRAMIDE HCL 10 MG PO TABS
10.0000 mg | ORAL_TABLET | Freq: Three times a day (TID) | ORAL | 1 refills | Status: DC | PRN
  Filled 2021-06-11: qty 45, 15d supply, fill #0
  Filled 2021-07-26: qty 45, 15d supply, fill #1

## 2021-06-23 ENCOUNTER — Other Ambulatory Visit (HOSPITAL_COMMUNITY): Payer: Self-pay

## 2021-06-23 MED ORDER — SERTRALINE HCL 25 MG PO TABS
25.0000 mg | ORAL_TABLET | Freq: Every day | ORAL | 0 refills | Status: DC
  Filled 2021-06-23: qty 30, 30d supply, fill #0

## 2021-06-24 ENCOUNTER — Other Ambulatory Visit (HOSPITAL_COMMUNITY): Payer: Self-pay

## 2021-06-24 ENCOUNTER — Telehealth: Payer: 59 | Admitting: Family Medicine

## 2021-06-24 DIAGNOSIS — H10021 Other mucopurulent conjunctivitis, right eye: Secondary | ICD-10-CM

## 2021-06-24 MED ORDER — ERYTHROMYCIN 5 MG/GM OP OINT
1.0000 "application " | TOPICAL_OINTMENT | Freq: Three times a day (TID) | OPHTHALMIC | 0 refills | Status: DC
  Filled 2021-06-24: qty 3.5, 7d supply, fill #0

## 2021-06-24 MED ORDER — AZITHROMYCIN 1 % OP SOLN
OPHTHALMIC | 0 refills | Status: AC
  Filled 2021-06-24: qty 2.5, 7d supply, fill #0

## 2021-06-24 NOTE — Progress Notes (Signed)
E-Visit for Pink Eye ? ? ?We are sorry that you are not feeling well.  Here is how we plan to help! ? ?Based on what you have shared with me it looks like you have conjunctivitis.  Conjunctivitis is a common inflammatory or infectious condition of the eye that is often referred to as "pink eye".  In most cases it is contagious (viral or bacterial). However, not all conjunctivitis requires antibiotics (ex. Allergic).  We have made appropriate suggestions for you based upon your presentation. ? ?Azasite eye drops- to be used as directed on package. ? ?Pink eye can be highly contagious.  It is typically spread through direct contact with secretions, or contaminated objects or surfaces that one may have touched.  Strict handwashing is suggested with soap and water is urged.  If not available, use alcohol based had sanitizer.  Avoid unnecessary touching of the eye.  If you wear contact lenses, you will need to refrain from wearing them until you see no white discharge from the eye for at least 24 hours after being on medication.  You should see symptom improvement in 1-2 days after starting the medication regimen.  Call us if symptoms are not improved in 1-2 days. ? ?Home Care: ?Wash your hands often! ?Do not wear your contacts until you complete your treatment plan. ?Avoid sharing towels, bed linen, personal items with a person who has pink eye. ?See attention for anyone in your home with similar symptoms. ? ?Get Help Right Away If: ?Your symptoms do not improve. ?You develop blurred or loss of vision. ?Your symptoms worsen (increased discharge, pain or redness) ? ? ?Thank you for choosing an e-visit. ? ?Your e-visit answers were reviewed by a board certified advanced clinical practitioner to complete your personal care plan. Depending upon the condition, your plan could have included both over the counter or prescription medications. ? ?Please review your pharmacy choice. Make sure the pharmacy is open so you can pick  up prescription now. If there is a problem, you may contact your provider through [employer] and have the prescription routed to another pharmacy.  Your safety is important to [place]. If you have drug allergies check your prescription carefully.  ? ?For the next 24 hours you can use MyChart to ask questions about today's visit, request a non-urgent call back, or ask for a work or school excuse. ?You will get an email in the next two days asking about your experience. I hope that your e-visit has been valuable and will speed your recovery. ? ? ?I provided 5 minutes of non face-to-face time during this encounter for chart review, medication and order placement, as well as and documentation.  ? ?

## 2021-06-24 NOTE — Addendum Note (Signed)
Addended by: Perlie Mayo on: 06/24/2021 12:09 PM ? ? Modules accepted: Orders ? ?

## 2021-06-26 DIAGNOSIS — Z6824 Body mass index (BMI) 24.0-24.9, adult: Secondary | ICD-10-CM | POA: Diagnosis not present

## 2021-06-26 DIAGNOSIS — H1031 Unspecified acute conjunctivitis, right eye: Secondary | ICD-10-CM | POA: Diagnosis not present

## 2021-06-27 ENCOUNTER — Other Ambulatory Visit (HOSPITAL_COMMUNITY): Payer: Self-pay

## 2021-06-28 ENCOUNTER — Other Ambulatory Visit (HOSPITAL_COMMUNITY): Payer: Self-pay

## 2021-06-28 DIAGNOSIS — Z3403 Encounter for supervision of normal first pregnancy, third trimester: Secondary | ICD-10-CM | POA: Diagnosis not present

## 2021-06-28 DIAGNOSIS — B303 Acute epidemic hemorrhagic conjunctivitis (enteroviral): Secondary | ICD-10-CM | POA: Diagnosis not present

## 2021-06-28 DIAGNOSIS — Z363 Encounter for antenatal screening for malformations: Secondary | ICD-10-CM | POA: Diagnosis not present

## 2021-06-28 DIAGNOSIS — Z3A19 19 weeks gestation of pregnancy: Secondary | ICD-10-CM | POA: Diagnosis not present

## 2021-06-28 DIAGNOSIS — O09519 Supervision of elderly primigravida, unspecified trimester: Secondary | ICD-10-CM | POA: Diagnosis not present

## 2021-06-28 MED ORDER — ONDANSETRON 4 MG PO TBDP
4.0000 mg | ORAL_TABLET | Freq: Three times a day (TID) | ORAL | 1 refills | Status: DC | PRN
  Filled 2021-06-28: qty 45, 15d supply, fill #0
  Filled 2021-07-13: qty 45, 15d supply, fill #1

## 2021-07-08 ENCOUNTER — Other Ambulatory Visit (HOSPITAL_COMMUNITY): Payer: Self-pay

## 2021-07-09 ENCOUNTER — Other Ambulatory Visit (HOSPITAL_COMMUNITY): Payer: Self-pay

## 2021-07-09 MED ORDER — PROMETHAZINE HCL 25 MG PO TABS
25.0000 mg | ORAL_TABLET | Freq: Three times a day (TID) | ORAL | 1 refills | Status: DC | PRN
  Filled 2021-07-09: qty 30, 10d supply, fill #0
  Filled 2021-08-11: qty 30, 10d supply, fill #1

## 2021-07-13 ENCOUNTER — Other Ambulatory Visit (HOSPITAL_COMMUNITY): Payer: Self-pay

## 2021-07-26 ENCOUNTER — Other Ambulatory Visit (HOSPITAL_COMMUNITY): Payer: Self-pay

## 2021-07-26 MED ORDER — SERTRALINE HCL 25 MG PO TABS
25.0000 mg | ORAL_TABLET | Freq: Every day | ORAL | 0 refills | Status: DC
Start: 2021-07-26 — End: 2021-08-23
  Filled 2021-07-26: qty 30, 30d supply, fill #0

## 2021-07-26 MED ORDER — ONDANSETRON 4 MG PO TBDP
4.0000 mg | ORAL_TABLET | Freq: Three times a day (TID) | ORAL | 1 refills | Status: DC | PRN
  Filled 2021-07-26: qty 45, 15d supply, fill #0
  Filled 2021-08-16: qty 45, 15d supply, fill #1

## 2021-07-27 ENCOUNTER — Other Ambulatory Visit (HOSPITAL_COMMUNITY): Payer: Self-pay

## 2021-07-28 DIAGNOSIS — M62838 Other muscle spasm: Secondary | ICD-10-CM | POA: Diagnosis not present

## 2021-07-28 DIAGNOSIS — N393 Stress incontinence (female) (male): Secondary | ICD-10-CM | POA: Diagnosis not present

## 2021-07-28 DIAGNOSIS — R102 Pelvic and perineal pain: Secondary | ICD-10-CM | POA: Diagnosis not present

## 2021-08-05 DIAGNOSIS — M62838 Other muscle spasm: Secondary | ICD-10-CM | POA: Diagnosis not present

## 2021-08-05 DIAGNOSIS — R102 Pelvic and perineal pain: Secondary | ICD-10-CM | POA: Diagnosis not present

## 2021-08-05 DIAGNOSIS — N393 Stress incontinence (female) (male): Secondary | ICD-10-CM | POA: Diagnosis not present

## 2021-08-11 ENCOUNTER — Other Ambulatory Visit (HOSPITAL_COMMUNITY): Payer: Self-pay

## 2021-08-16 ENCOUNTER — Inpatient Hospital Stay: Payer: 59

## 2021-08-16 ENCOUNTER — Other Ambulatory Visit (HOSPITAL_COMMUNITY): Payer: Self-pay

## 2021-08-17 ENCOUNTER — Inpatient Hospital Stay: Payer: 59 | Admitting: Hematology and Oncology

## 2021-08-21 ENCOUNTER — Other Ambulatory Visit (HOSPITAL_COMMUNITY): Payer: Self-pay

## 2021-08-23 ENCOUNTER — Other Ambulatory Visit (HOSPITAL_COMMUNITY): Payer: Self-pay

## 2021-08-23 MED ORDER — SERTRALINE HCL 25 MG PO TABS
25.0000 mg | ORAL_TABLET | Freq: Every day | ORAL | 0 refills | Status: DC
Start: 2021-08-23 — End: 2021-09-23
  Filled 2021-08-23: qty 30, 30d supply, fill #0

## 2021-08-25 ENCOUNTER — Other Ambulatory Visit: Payer: Self-pay | Admitting: *Deleted

## 2021-08-26 ENCOUNTER — Inpatient Hospital Stay: Payer: 59 | Attending: Hematology and Oncology

## 2021-08-26 DIAGNOSIS — Z3689 Encounter for other specified antenatal screening: Secondary | ICD-10-CM | POA: Diagnosis not present

## 2021-08-26 DIAGNOSIS — O99283 Endocrine, nutritional and metabolic diseases complicating pregnancy, third trimester: Secondary | ICD-10-CM | POA: Insufficient documentation

## 2021-08-26 DIAGNOSIS — Z79899 Other long term (current) drug therapy: Secondary | ICD-10-CM | POA: Diagnosis not present

## 2021-08-26 DIAGNOSIS — Z3A27 27 weeks gestation of pregnancy: Secondary | ICD-10-CM | POA: Diagnosis not present

## 2021-08-26 DIAGNOSIS — Z3A28 28 weeks gestation of pregnancy: Secondary | ICD-10-CM | POA: Diagnosis not present

## 2021-08-26 DIAGNOSIS — Z23 Encounter for immunization: Secondary | ICD-10-CM | POA: Diagnosis not present

## 2021-08-26 LAB — CBC WITH DIFFERENTIAL (CANCER CENTER ONLY)
Abs Immature Granulocytes: 0.08 10*3/uL — ABNORMAL HIGH (ref 0.00–0.07)
Basophils Absolute: 0 10*3/uL (ref 0.0–0.1)
Basophils Relative: 0 %
Eosinophils Absolute: 0 10*3/uL (ref 0.0–0.5)
Eosinophils Relative: 0 %
HCT: 32.6 % — ABNORMAL LOW (ref 36.0–46.0)
Hemoglobin: 10.8 g/dL — ABNORMAL LOW (ref 12.0–15.0)
Immature Granulocytes: 1 %
Lymphocytes Relative: 8 %
Lymphs Abs: 0.6 10*3/uL — ABNORMAL LOW (ref 0.7–4.0)
MCH: 29.8 pg (ref 26.0–34.0)
MCHC: 33.1 g/dL (ref 30.0–36.0)
MCV: 89.8 fL (ref 80.0–100.0)
Monocytes Absolute: 0.6 10*3/uL (ref 0.1–1.0)
Monocytes Relative: 8 %
Neutro Abs: 6.5 10*3/uL (ref 1.7–7.7)
Neutrophils Relative %: 83 %
Platelet Count: 235 10*3/uL (ref 150–400)
RBC: 3.63 MIL/uL — ABNORMAL LOW (ref 3.87–5.11)
RDW: 12.1 % (ref 11.5–15.5)
WBC Count: 7.9 10*3/uL (ref 4.0–10.5)
nRBC: 0 % (ref 0.0–0.2)

## 2021-08-26 LAB — IRON AND IRON BINDING CAPACITY (CC-WL,HP ONLY)
Iron: 168 ug/dL (ref 28–170)
Saturation Ratios: 42 % — ABNORMAL HIGH (ref 10.4–31.8)
TIBC: 399 ug/dL (ref 250–450)
UIBC: 231 ug/dL (ref 148–442)

## 2021-08-27 ENCOUNTER — Other Ambulatory Visit (HOSPITAL_COMMUNITY): Payer: Self-pay

## 2021-08-27 ENCOUNTER — Inpatient Hospital Stay (HOSPITAL_BASED_OUTPATIENT_CLINIC_OR_DEPARTMENT_OTHER): Payer: 59 | Admitting: Hematology and Oncology

## 2021-08-27 LAB — FERRITIN: Ferritin: 85 ng/mL (ref 11–307)

## 2021-08-27 NOTE — Assessment & Plan Note (Signed)
Current pregnancy Lab review 04/26/2021: Hemoglobin electrophoresis: Normal hemoglobin, hemoglobin 11, MCV 86, platelets 338, iron saturation: 59%, ferritin 374, TIBC 299  10 years ago she had hemochromatosis gene testing and was apparently diagnosed with hereditary hemochromatosis but she has not required any phlebotomies.  Initially she did donate blood but over time [facility] refused to take her blood.  Current pregnancy: 28 weeks Lab review: Hemoglobin 10.8, iron saturation 42%, ferritin 85 Hemochromatosis goal: To keep ferritin below 100  Once she delivers she may have some blood loss and that might reduce iron content. Return to clinic 1 month after delivery to recheck her labs and I can do a telephone visit after that to discuss results.

## 2021-08-27 NOTE — Progress Notes (Signed)
HEMATOLOGY-ONCOLOGY TELEPHONE VISIT PROGRESS NOTE  I connected with our patient on 08/27/21 at 10:45 AM EDT by telephone and verified that I am speaking with the correct person using two identifiers.  I discussed the limitations, risks, security and privacy concerns of performing an evaluation and management service by telephone and the availability of in person appointments.  I also discussed with the patient that there may be a patient responsible charge related to this service. The patient expressed understanding and agreed to proceed.   History of Present Illness: Michelle Wilkins 36 y.o. female is here because of recent diagnosis of hemochromatosis. She presents to the clinic today for telephone follow-up.  She is currently [redacted] weeks pregnant and appears to be doing quite reasonably well.  She does have diffuse body aches and pains.    REVIEW OF SYSTEMS:   Constitutional: Denies fevers, chills or abnormal weight loss All other systems were reviewed with the patient and are negative.     Assessment Plan:  Hemochromatosis Current pregnancy Lab review 04/26/2021: Hemoglobin electrophoresis: Normal hemoglobin, hemoglobin 11, MCV 86, platelets 338, iron saturation: 59%, ferritin 374, TIBC 299  10 years ago she had hemochromatosis gene testing and was apparently diagnosed with hereditary hemochromatosis but she has not required any phlebotomies.  Initially she did donate blood but over time [facility] refused to take her blood.  Current pregnancy: 28 weeks Lab review: Hemoglobin 10.8, iron saturation 42%, ferritin 85 Hemochromatosis goal: To keep ferritin below 100  Once she delivers she may have some blood loss and that might reduce iron content. Return to clinic 1 month after delivery to recheck her labs and I can do a telephone visit after that to discuss results.      I discussed the assessment and treatment plan with the patient. The patient was provided an opportunity to ask questions  and all were answered. The patient agreed with the plan and demonstrated an understanding of the instructions. The patient was advised to call back or seek an in-person evaluation if the symptoms worsen or if the condition fails to improve as anticipated.   I provided 11 minutes of non-face-to-face time during this encounter.  This includes time for charting and coordination of care   Harriette Ohara, MD   I Gardiner Coins am scribing for Dr. Lindi Adie  I have reviewed the above documentation for accuracy and completeness, and I agree with the above.

## 2021-08-28 ENCOUNTER — Other Ambulatory Visit (HOSPITAL_COMMUNITY): Payer: Self-pay

## 2021-08-30 ENCOUNTER — Telehealth: Payer: Self-pay | Admitting: Hematology and Oncology

## 2021-08-30 NOTE — Telephone Encounter (Signed)
Scheduled appointment per 7/7 los. Left voicemail.

## 2021-09-08 ENCOUNTER — Other Ambulatory Visit (HOSPITAL_COMMUNITY): Payer: Self-pay

## 2021-09-08 MED ORDER — ONDANSETRON 4 MG PO TBDP
ORAL_TABLET | ORAL | 1 refills | Status: DC
Start: 2021-09-08 — End: 2021-11-21
  Filled 2021-09-08: qty 45, 15d supply, fill #0

## 2021-09-08 MED ORDER — ONDANSETRON 4 MG PO TBDP
4.0000 mg | ORAL_TABLET | Freq: Three times a day (TID) | ORAL | 1 refills | Status: DC
  Filled 2021-09-08: qty 45, 15d supply, fill #0
  Filled 2021-09-22: qty 45, 15d supply, fill #1

## 2021-09-09 ENCOUNTER — Other Ambulatory Visit (HOSPITAL_COMMUNITY): Payer: Self-pay

## 2021-09-15 ENCOUNTER — Other Ambulatory Visit (HOSPITAL_COMMUNITY): Payer: Self-pay

## 2021-09-15 MED ORDER — METOCLOPRAMIDE HCL 10 MG PO TABS
10.0000 mg | ORAL_TABLET | Freq: Three times a day (TID) | ORAL | 1 refills | Status: DC | PRN
  Filled 2021-09-15: qty 45, 15d supply, fill #0
  Filled 2021-11-04: qty 45, 15d supply, fill #1

## 2021-09-22 ENCOUNTER — Other Ambulatory Visit (HOSPITAL_COMMUNITY): Payer: Self-pay

## 2021-09-23 ENCOUNTER — Other Ambulatory Visit (HOSPITAL_COMMUNITY): Payer: Self-pay

## 2021-09-23 MED ORDER — SERTRALINE HCL 25 MG PO TABS
25.0000 mg | ORAL_TABLET | Freq: Every day | ORAL | 0 refills | Status: DC
  Filled 2021-09-23: qty 30, 30d supply, fill #0

## 2021-09-29 DIAGNOSIS — Z3A32 32 weeks gestation of pregnancy: Secondary | ICD-10-CM | POA: Diagnosis not present

## 2021-09-29 DIAGNOSIS — O99343 Other mental disorders complicating pregnancy, third trimester: Secondary | ICD-10-CM | POA: Diagnosis not present

## 2021-09-29 DIAGNOSIS — O09523 Supervision of elderly multigravida, third trimester: Secondary | ICD-10-CM | POA: Diagnosis not present

## 2021-09-29 DIAGNOSIS — O365931 Maternal care for other known or suspected poor fetal growth, third trimester, fetus 1: Secondary | ICD-10-CM | POA: Diagnosis not present

## 2021-10-13 ENCOUNTER — Other Ambulatory Visit (HOSPITAL_COMMUNITY): Payer: Self-pay

## 2021-10-14 ENCOUNTER — Other Ambulatory Visit (HOSPITAL_COMMUNITY): Payer: Self-pay

## 2021-10-14 MED ORDER — ONDANSETRON 4 MG PO TBDP
4.0000 mg | ORAL_TABLET | Freq: Three times a day (TID) | ORAL | 1 refills | Status: DC | PRN
  Filled 2021-10-14: qty 30, 10d supply, fill #0
  Filled 2021-10-14: qty 15, 5d supply, fill #0

## 2021-10-15 ENCOUNTER — Other Ambulatory Visit (HOSPITAL_COMMUNITY): Payer: Self-pay

## 2021-10-26 ENCOUNTER — Other Ambulatory Visit (HOSPITAL_COMMUNITY): Payer: Self-pay

## 2021-10-26 MED ORDER — SERTRALINE HCL 25 MG PO TABS
25.0000 mg | ORAL_TABLET | Freq: Every day | ORAL | 0 refills | Status: DC
  Filled 2021-10-26: qty 30, 30d supply, fill #0

## 2021-10-28 DIAGNOSIS — Z3689 Encounter for other specified antenatal screening: Secondary | ICD-10-CM | POA: Diagnosis not present

## 2021-10-28 LAB — OB RESULTS CONSOLE GBS: GBS: NEGATIVE

## 2021-11-03 ENCOUNTER — Other Ambulatory Visit (HOSPITAL_COMMUNITY): Payer: Self-pay

## 2021-11-03 MED ORDER — LINZESS 72 MCG PO CAPS
72.0000 ug | ORAL_CAPSULE | Freq: Every day | ORAL | 0 refills | Status: DC
  Filled 2021-11-03: qty 30, 30d supply, fill #0

## 2021-11-03 MED ORDER — LIDOCAINE (ANORECTAL) 5 % EX CREA
TOPICAL_CREAM | Freq: Three times a day (TID) | CUTANEOUS | 1 refills | Status: DC | PRN
  Filled 2021-11-03: qty 30, 20d supply, fill #0

## 2021-11-03 MED ORDER — HYDROCORTISONE ACETATE 25 MG RE SUPP
25.0000 mg | Freq: Two times a day (BID) | RECTAL | 0 refills | Status: DC
  Filled 2021-11-03: qty 28, 14d supply, fill #0

## 2021-11-04 ENCOUNTER — Other Ambulatory Visit (HOSPITAL_COMMUNITY): Payer: Self-pay

## 2021-11-08 ENCOUNTER — Telehealth (HOSPITAL_COMMUNITY): Payer: Self-pay | Admitting: *Deleted

## 2021-11-08 NOTE — Telephone Encounter (Signed)
Preadmission screen  

## 2021-11-09 ENCOUNTER — Encounter (HOSPITAL_COMMUNITY): Payer: Self-pay

## 2021-11-10 ENCOUNTER — Telehealth (HOSPITAL_COMMUNITY): Payer: Self-pay | Admitting: *Deleted

## 2021-11-10 ENCOUNTER — Encounter (HOSPITAL_COMMUNITY): Payer: Self-pay | Admitting: *Deleted

## 2021-11-10 DIAGNOSIS — Z3A38 38 weeks gestation of pregnancy: Secondary | ICD-10-CM | POA: Diagnosis not present

## 2021-11-10 DIAGNOSIS — Z3403 Encounter for supervision of normal first pregnancy, third trimester: Secondary | ICD-10-CM | POA: Diagnosis not present

## 2021-11-10 DIAGNOSIS — Z23 Encounter for immunization: Secondary | ICD-10-CM | POA: Diagnosis not present

## 2021-11-10 NOTE — Telephone Encounter (Signed)
Preadmission screen  

## 2021-11-15 ENCOUNTER — Other Ambulatory Visit: Payer: 59

## 2021-11-16 ENCOUNTER — Inpatient Hospital Stay (HOSPITAL_COMMUNITY): Payer: 59

## 2021-11-17 ENCOUNTER — Telehealth: Payer: 59 | Admitting: Hematology and Oncology

## 2021-11-18 ENCOUNTER — Other Ambulatory Visit: Payer: Self-pay

## 2021-11-18 ENCOUNTER — Inpatient Hospital Stay (HOSPITAL_COMMUNITY)
Admission: AD | Admit: 2021-11-18 | Discharge: 2021-11-21 | DRG: 806 | Disposition: A | Discharge status: Home / Self Care | Payer: 59 | Attending: Obstetrics | Admitting: Obstetrics

## 2021-11-18 DIAGNOSIS — F419 Anxiety disorder, unspecified: Secondary | ICD-10-CM | POA: Diagnosis not present

## 2021-11-18 DIAGNOSIS — O99344 Other mental disorders complicating childbirth: Principal | ICD-10-CM | POA: Diagnosis present

## 2021-11-18 DIAGNOSIS — O872 Hemorrhoids in the puerperium: Secondary | ICD-10-CM | POA: Diagnosis not present

## 2021-11-18 DIAGNOSIS — Z3A39 39 weeks gestation of pregnancy: Secondary | ICD-10-CM | POA: Diagnosis not present

## 2021-11-18 DIAGNOSIS — O26893 Other specified pregnancy related conditions, third trimester: Secondary | ICD-10-CM | POA: Diagnosis present

## 2021-11-18 DIAGNOSIS — Z148 Genetic carrier of other disease: Secondary | ICD-10-CM

## 2021-11-18 DIAGNOSIS — Z3483 Encounter for supervision of other normal pregnancy, third trimester: Principal | ICD-10-CM

## 2021-11-18 DIAGNOSIS — O9912 Other diseases of the blood and blood-forming organs and certain disorders involving the immune mechanism complicating childbirth: Secondary | ICD-10-CM | POA: Diagnosis not present

## 2021-11-18 MED ORDER — OXYCODONE-ACETAMINOPHEN 5-325 MG PO TABS: 2.0000 | ORAL_TABLET | ORAL | Status: DC | PRN

## 2021-11-18 MED ORDER — ONDANSETRON HCL 4 MG/2ML IJ SOLN
4.0000 mg | Freq: Four times a day (QID) | INTRAMUSCULAR | Status: DC | PRN
  Administered 2021-11-19: 4 mg via INTRAVENOUS
  Filled 2021-11-18: qty 2

## 2021-11-18 MED ORDER — SOD CITRATE-CITRIC ACID 500-334 MG/5ML PO SOLN: 30.0000 mL | ORAL | Status: DC | PRN

## 2021-11-18 MED ORDER — OXYTOCIN-SODIUM CHLORIDE 30-0.9 UT/500ML-% IV SOLN
2.5000 [IU]/h | INTRAVENOUS | Status: DC
  Filled 2021-11-18: qty 500

## 2021-11-18 MED ORDER — LACTATED RINGERS IV SOLN: 500.0000 mL | INTRAVENOUS | Status: DC | PRN

## 2021-11-18 MED ORDER — OXYTOCIN BOLUS FROM INFUSION
333.0000 mL | Freq: Once | INTRAVENOUS | Status: AC
  Administered 2021-11-19: 333 mL via INTRAVENOUS

## 2021-11-18 MED ORDER — LACTATED RINGERS IV SOLN
INTRAVENOUS | Status: DC
  Administered 2021-11-18: 125 mL via INTRAVENOUS

## 2021-11-18 MED ORDER — TERBUTALINE SULFATE 1 MG/ML IJ SOLN: 0.2500 mg | Freq: Once | INTRAMUSCULAR | Status: DC | PRN

## 2021-11-18 MED ORDER — OXYCODONE-ACETAMINOPHEN 5-325 MG PO TABS: 1.0000 | ORAL_TABLET | ORAL | Status: DC | PRN

## 2021-11-18 MED ORDER — LIDOCAINE HCL (PF) 1 % IJ SOLN: 30.0000 mL | INTRAMUSCULAR | Status: DC | PRN

## 2021-11-18 MED ORDER — ACETAMINOPHEN 325 MG PO TABS: 650.0000 mg | ORAL_TABLET | ORAL | Status: DC | PRN

## 2021-11-18 MED ORDER — FENTANYL CITRATE (PF) 100 MCG/2ML IJ SOLN: 50.0000 ug | INTRAMUSCULAR | Status: DC | PRN

## 2021-11-19 ENCOUNTER — Other Ambulatory Visit (HOSPITAL_COMMUNITY): Payer: Self-pay

## 2021-11-19 ENCOUNTER — Inpatient Hospital Stay (HOSPITAL_COMMUNITY): Payer: 59 | Admitting: Anesthesiology

## 2021-11-19 ENCOUNTER — Encounter (HOSPITAL_COMMUNITY): Payer: Self-pay | Admitting: Obstetrics

## 2021-11-19 LAB — CBC
HCT: 34.8 % — ABNORMAL LOW (ref 36.0–46.0)
Hemoglobin: 12 g/dL (ref 12.0–15.0)
MCH: 30.7 pg (ref 26.0–34.0)
MCHC: 34.5 g/dL (ref 30.0–36.0)
MCV: 89 fL (ref 80.0–100.0)
Platelets: 257 10*3/uL (ref 150–400)
RBC: 3.91 MIL/uL (ref 3.87–5.11)
RDW: 12.4 % (ref 11.5–15.5)
WBC: 11.6 10*3/uL — ABNORMAL HIGH (ref 4.0–10.5)
nRBC: 0 % (ref 0.0–0.2)

## 2021-11-19 LAB — TYPE AND SCREEN
ABO/RH(D): O POS
Antibody Screen: NEGATIVE

## 2021-11-19 LAB — RPR: RPR Ser Ql: NONREACTIVE

## 2021-11-19 MED ORDER — HYDROCORTISONE (PERIANAL) 2.5 % EX CREA
TOPICAL_CREAM | Freq: Three times a day (TID) | CUTANEOUS | 0 refills | Status: DC
  Filled 2021-11-19: qty 30, 10d supply, fill #0

## 2021-11-19 MED ORDER — COCONUT OIL OIL: 1.0000 | TOPICAL_OIL | Status: DC | PRN

## 2021-11-19 MED ORDER — FENTANYL-BUPIVACAINE-NACL 0.5-0.125-0.9 MG/250ML-% EP SOLN
EPIDURAL | Status: AC
  Filled 2021-11-19: qty 250

## 2021-11-19 MED ORDER — ONDANSETRON HCL 4 MG PO TABS: 4.0000 mg | ORAL_TABLET | ORAL | Status: DC | PRN

## 2021-11-19 MED ORDER — ONDANSETRON HCL 4 MG/2ML IJ SOLN: 4.0000 mg | INTRAMUSCULAR | Status: DC | PRN

## 2021-11-19 MED ORDER — LIDOCAINE HCL (PF) 1 % IJ SOLN
INTRAMUSCULAR | Status: DC | PRN
  Administered 2021-11-19: 6 mL via EPIDURAL
  Administered 2021-11-19: 4 mL via EPIDURAL

## 2021-11-19 MED ORDER — SIMETHICONE 80 MG PO CHEW: 80.0000 mg | CHEWABLE_TABLET | ORAL | Status: DC | PRN

## 2021-11-19 MED ORDER — OXYTOCIN-SODIUM CHLORIDE 30-0.9 UT/500ML-% IV SOLN
1.0000 m[IU]/min | INTRAVENOUS | Status: DC
  Administered 2021-11-19: 2 m[IU]/min via INTRAVENOUS

## 2021-11-19 MED ORDER — POLYETHYLENE GLYCOL 3350 17 G PO PACK
17.0000 g | PACK | Freq: Every day | ORAL | Status: DC
  Administered 2021-11-19 – 2021-11-20 (×2): 17 g via ORAL
  Filled 2021-11-19 (×2): qty 1

## 2021-11-19 MED ORDER — TETANUS-DIPHTH-ACELL PERTUSSIS 5-2.5-18.5 LF-MCG/0.5 IM SUSY: 0.5000 mL | PREFILLED_SYRINGE | Freq: Once | INTRAMUSCULAR | Status: DC

## 2021-11-19 MED ORDER — EPHEDRINE 5 MG/ML INJ: 10.0000 mg | INTRAVENOUS | Status: DC | PRN

## 2021-11-19 MED ORDER — IBUPROFEN 600 MG PO TABS
600.0000 mg | ORAL_TABLET | Freq: Four times a day (QID) | ORAL | Status: DC
  Administered 2021-11-19 – 2021-11-21 (×9): 600 mg via ORAL
  Filled 2021-11-19 (×9): qty 1

## 2021-11-19 MED ORDER — LACTATED RINGERS IV SOLN: 500.0000 mL | Freq: Once | INTRAVENOUS | Status: DC

## 2021-11-19 MED ORDER — DIPHENHYDRAMINE HCL 50 MG/ML IJ SOLN: 12.5000 mg | INTRAMUSCULAR | Status: DC | PRN

## 2021-11-19 MED ORDER — DIBUCAINE (PERIANAL) 1 % EX OINT: 1.0000 | TOPICAL_OINTMENT | CUTANEOUS | Status: DC | PRN

## 2021-11-19 MED ORDER — OXYCODONE HCL 5 MG PO TABS: 10.0000 mg | ORAL_TABLET | ORAL | Status: DC | PRN

## 2021-11-19 MED ORDER — OXYCODONE HCL 5 MG PO TABS: 5.0000 mg | ORAL_TABLET | ORAL | Status: DC | PRN

## 2021-11-19 MED ORDER — SENNOSIDES-DOCUSATE SODIUM 8.6-50 MG PO TABS
2.0000 | ORAL_TABLET | ORAL | Status: DC
  Administered 2021-11-19 – 2021-11-20 (×2): 2 via ORAL
  Filled 2021-11-19 (×2): qty 2

## 2021-11-19 MED ORDER — HYDROCORTISONE (PERIANAL) 2.5 % EX CREA
TOPICAL_CREAM | Freq: Three times a day (TID) | CUTANEOUS | Status: DC
  Filled 2021-11-19: qty 28.35

## 2021-11-19 MED ORDER — PHENYLEPHRINE 80 MCG/ML (10ML) SYRINGE FOR IV PUSH (FOR BLOOD PRESSURE SUPPORT): 80.0000 ug | PREFILLED_SYRINGE | INTRAVENOUS | Status: DC | PRN

## 2021-11-19 MED ORDER — BENZOCAINE-MENTHOL 20-0.5 % EX AERO
1.0000 | INHALATION_SPRAY | CUTANEOUS | Status: DC | PRN
  Administered 2021-11-19: 1 via TOPICAL
  Filled 2021-11-19: qty 56

## 2021-11-19 MED ORDER — SERTRALINE HCL 25 MG PO TABS
25.0000 mg | ORAL_TABLET | Freq: Every day | ORAL | Status: DC
  Administered 2021-11-19 – 2021-11-20 (×2): 25 mg via ORAL
  Filled 2021-11-19 (×2): qty 1

## 2021-11-19 MED ORDER — WITCH HAZEL-GLYCERIN EX PADS
1.0000 | MEDICATED_PAD | CUTANEOUS | Status: DC | PRN
  Administered 2021-11-19: 1 via TOPICAL

## 2021-11-19 MED ORDER — FENTANYL-BUPIVACAINE-NACL 0.5-0.125-0.9 MG/250ML-% EP SOLN
12.0000 mL/h | EPIDURAL | Status: DC | PRN
  Administered 2021-11-19: 12 mL/h via EPIDURAL

## 2021-11-19 MED ORDER — PRENATAL MULTIVITAMIN CH
1.0000 | ORAL_TABLET | Freq: Every day | ORAL | Status: DC
  Administered 2021-11-19 – 2021-11-21 (×3): 1 via ORAL
  Filled 2021-11-19 (×3): qty 1

## 2021-11-19 MED ORDER — HYDROCORTISONE ACETATE 25 MG RE SUPP
25.0000 mg | Freq: Two times a day (BID) | RECTAL | Status: DC
  Administered 2021-11-19 – 2021-11-20 (×2): 25 mg via RECTAL
  Filled 2021-11-19 (×6): qty 1

## 2021-11-19 MED ORDER — TERBUTALINE SULFATE 1 MG/ML IJ SOLN: 0.2500 mg | Freq: Once | INTRAMUSCULAR | Status: DC | PRN

## 2021-11-19 MED ORDER — HYDROCORTISONE ACETATE 25 MG RE SUPP
25.0000 mg | Freq: Two times a day (BID) | RECTAL | 0 refills | Status: DC
  Filled 2021-11-19: qty 28, 14d supply, fill #0

## 2021-11-19 MED ORDER — DIPHENHYDRAMINE HCL 25 MG PO CAPS
25.0000 mg | ORAL_CAPSULE | Freq: Four times a day (QID) | ORAL | Status: DC | PRN
  Administered 2021-11-19: 25 mg via ORAL
  Filled 2021-11-19: qty 1

## 2021-11-19 MED ORDER — ACETAMINOPHEN 325 MG PO TABS: 650.0000 mg | ORAL_TABLET | ORAL | Status: DC | PRN

## 2021-11-19 NOTE — Lactation Note (Signed)
This note was copied from a baby's chart. Lactation Consultation Note  Patient Name: Michelle Wilkins [name] Date: 11/19/2021 Reason for consult: L&D Initial assessment Age:35 hours  P2, Per RN birth mother has good flow of colostrum but baby has been fussy and not sustaining latch. LC checked suck with finger and baby has strong suck, Birth parent latched baby and she was able to sustain latch for more than 5 min while LC was in room. Lactation to follow up on MBU.   Maternal Data Does the patient have breastfeeding experience prior to this delivery?: Yes  Feeding Mother's Current Feeding Choice: Breast Milk  LATCH Score Latch: Repeated attempts needed to sustain latch, nipple held in mouth throughout feeding, stimulation needed to elicit sucking reflex.  Audible Swallowing: None  Type of Nipple: Everted at rest and after stimulation  Comfort (Breast/Nipple): Soft / non-tender  Hold (Positioning): No assistance needed to correctly position infant at breast.  LATCH Score: 7   Lactation Tools Discussed/Used    Interventions Interventions: Skin to skin;Assisted with latch;Hand express;Position options;Support pillows;Adjust position  Discharge    Consult Status Consult Status: Follow-up from L&D    Vivianne Master [facility] 11/19/2021, 8:03 AM

## 2021-11-19 NOTE — Anesthesia Preprocedure Evaluation (Addendum)
Anesthesia Evaluation  Patient identified by MRN, date of birth, ID band Patient awake    Reviewed: Allergy & Precautions, H&P , NPO status , Patient's Chart, lab work & pertinent test results  History of Anesthesia Complications Negative for: history of anesthetic complications  Airway Mallampati: II  TM Distance: >3 FB     Dental   Pulmonary neg pulmonary ROS,    Pulmonary exam normal        Cardiovascular negative cardio ROS   Rhythm:regular Rate:Normal     Neuro/Psych negative neurological ROS  negative psych ROS   GI/Hepatic negative GI ROS, Neg liver ROS,   Endo/Other  negative endocrine ROS  Renal/GU negative Renal ROS  negative genitourinary   Musculoskeletal   Abdominal   Peds  Hematology Hemochromatosis- asymptomatic, followed by hematology, treated with periodic phlebotomy   Anesthesia Other Findings   Reproductive/Obstetrics (+) Pregnancy                            Anesthesia Physical Anesthesia Plan  ASA: 2  Anesthesia Plan: Epidural   Post-op Pain Management:    Induction:   PONV Risk Score and Plan:   Airway Management Planned:   Additional Equipment:   Intra-op Plan:   Post-operative Plan:   Informed Consent: I have reviewed the patients History and Physical, chart, labs and discussed the procedure including the risks, benefits and alternatives for the proposed anesthesia with the patient or authorized representative who has indicated his/her understanding and acceptance.       Plan Discussed with:   Anesthesia Plan Comments:         Anesthesia Quick Evaluation

## 2021-11-19 NOTE — Anesthesia Procedure Notes (Signed)
Epidural Patient location during procedure: OB Start time: 11/19/2021 5:29 AM End time: 11/19/2021 5:40 AM  Staffing Anesthesiologist: Lidia Collum, MD Performed: anesthesiologist   Preanesthetic Checklist Completed: patient identified, IV checked, risks and benefits discussed, monitors and equipment checked, pre-op evaluation and timeout performed  Epidural Patient position: sitting Prep: DuraPrep Patient monitoring: heart rate, continuous pulse ox and blood pressure Approach: midline Location: L3-L4 Injection technique: LOR air  Needle:  Needle type: Tuohy  Needle gauge: 17 G Needle length: 9 cm Needle insertion depth: 5 cm Catheter type: closed end flexible Catheter size: 19 Gauge Catheter at skin depth: 10 cm Test dose: negative  Assessment Events: blood not aspirated, injection not painful, no injection resistance, no paresthesia and negative IV test  Additional Notes Reason for block:procedure for pain

## 2021-11-19 NOTE — Lactation Note (Signed)
This note was copied from a baby's chart. Lactation Consultation Note  Patient Name: Michelle Wilkins [id number] Date: 11/19/2021 Reason for consult: Initial assessment;Term Age:35 hours   P2: Term infant at 39+4 weeks Feeding preference: Breast  Family had visitors present when I arrived.  Noticed "[name]" showing feeding cues and offered to assist with latching.  Birth parent receptive.  Reviewed breast feeding basics.  Birth parent familiar with hand expression.  Demonstrated finger feeding/spoon feeding any colostrum birth parent is able to obtain.  "[name]" latched easily in the cross cradle hold; birth parent latched independently with only guidance from myself.  Encouraged to feed 8-12 times/24 hours or sooner if baby shows cues.  Suggested she call her RN/LC for latch assistance as needed.   Maternal Data Has patient been taught Hand Expression?: Yes Does the patient have breastfeeding experience prior to this delivery?: Yes  Feeding Mother's Current Feeding Choice: Breast Milk  LATCH Score Latch: Grasps breast easily, tongue down, lips flanged, rhythmical sucking.  Audible Swallowing: A few with stimulation  Type of Nipple: Everted at rest and after stimulation  Comfort (Breast/Nipple): Soft / non-tender  Hold (Positioning): Assistance needed to correctly position infant at breast and maintain latch.  LATCH Score: 8   Lactation Tools Discussed/Used    Interventions Interventions: Breast feeding basics reviewed;Assisted with latch;Skin to skin;Breast massage;Hand express;Breast compression;Adjust position;Support pillows;Position options;Expressed milk;Education;LC Services brochure  Discharge Pump: Personal  Consult Status Consult Status: Follow-up Date: 11/20/21 Follow-up type: In-patient    Arthurine Oleary R Delonte Musich 11/19/2021, 10:12 AM

## 2021-11-19 NOTE — H&P (Signed)
36 y.o. G2P1001 @ 82w4dpresents for elective induction of labor.  Otherwise has good fetal movement and no bleeding.  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  Past Medical History:  Diagnosis Date   Hemochromatosis    Medical history non-contributory     Past Surgical History:  Procedure Laterality Date   NO PAST SURGERIES      OB History  Gravida Para Term Preterm AB Living  '2 1 1     1  '$ SAB IAB Ectopic Multiple Live Births        0 1    # Outcome Date GA Lbr Len/2nd Weight Sex Delivery Anes PTL Lv  2 Current           1 Term 01/13/18 [id number]:45 / 01:51 3430 g M Vag-Spont EPI  LIV    Social History   Socioeconomic History   Marital status: Married    Spouse name: Not on file   Number of children: Not on file   Years of education: Not on file   Highest education level: Not on file  Occupational History   Not on file  Tobacco Use   Smoking status: Never   Smokeless tobacco: Never  Vaping Use   Vaping Use: Never used  Substance and Sexual Activity   Alcohol use: No   Drug use: No   Sexual activity: Yes    Birth control/protection: None    Comment: pregnant  Other Topics Concern   Not on file  Social History Narrative   Not on file   Social Determinants of Health   Financial Resource Strain: Not on file  Food Insecurity: No Food Insecurity (11/19/2021)   Hunger Vital Sign    Worried About Running Out of Food in the Last Year: Never true    Ran Out of Food in the Last Year: Never true  Transportation Needs: No Transportation Needs (11/19/2021)   PRAPARE - TrHydrologistMedical): No    Lack of Transportation (Non-Medical): No  Physical Activity: Not on file  Stress: Not on file  Social Connections: Not on file  Intimate Partner Violence: Not At Risk (11/19/2021)   Humiliation, Afraid, Rape, and Kick questionnaire    Fear of Current or Ex-Partner: No     Emotionally Abused: No    Physically Abused: No    Sexually Abused: No   Patient has no known allergies.    Prenatal Transfer Tool  Maternal Diabetes: No Genetic Screening: Normal Maternal Ultrasounds/Referrals: Normal Fetal Ultrasounds or other Referrals:  None Maternal Substance Abuse:  No Significant Maternal Medications:  None Significant Maternal Lab Results: Group B Strep negative  ABO, Rh: --/--/O POS (09/28 2350) Antibody: NEG (09/28 2350) Rubella: Immune (03/06 0000) RPR: Nonreactive (03/06 0000)  HBsAg: Negative (03/06 0000)  HIV: Non-reactive (03/06 0000)  GBS: Negative/-- (09/07 0000)      Vitals:   11/19/21 0201 11/19/21 0231  BP: 122/69 122/71  Pulse: (!) 58 62  Resp: 18 16  Temp:       General:  NAD Abdomen:  soft, gravid, EFW 6.5# Ex:  no edema SVE:  3/70/-2, AROM clear fluid FHTs:  110s, moderate variability, + acceleration, category 1 Toco:  q3-6 minutes   A/P   [age].o. G2P1001 3962w4desents for elective induction of labor Continue pitocin GBS negative Anticipate SVD    DYAEustis

## 2021-11-19 NOTE — Progress Notes (Signed)
       Subjective: Patient just delivered he second child.  She has been dealing with hemorrhoids for the last 2 weeks.  She has been undergoing different treatments including lidocaine jelly, linzess, tucks, prep H, etc.  There was a concern about the possibility that these could be thrombosed.  We have been asked to see her for further evaluation.    ROS: See above, otherwise other systems negative  Objective: Vital signs in last 24 hours: Temp:  [98 F (36.7 C)-98.6 F (37 C)] 98.6 F (37 C) (09/29 0930) Pulse Rate:  [58-130] 72 (09/29 0930) Resp:  [16-18] 17 (09/29 0930) BP: (86-146)/(58-90) 110/78 (09/29 0930) SpO2:  [97 %-98 %] 97 % (09/29 0930) Weight:  [73.3 kg] 73.3 kg (09/29 0013)    Intake/Output from previous day: 09/28 0701 - 09/29 0700 In: -  Out: 125 [Blood:125] Intake/Output this shift: No intake/output data recorded.  PE: Rectal: several moderate sized external hemorrhoids.  These are soft and not thrombosed.  She still has part of her epidural functioning so difficult to assess tenderness.    Lab Results:  Recent Labs    11/18/21 2350  WBC 11.6*  HGB 12.0  HCT 34.8*  PLT 257   BMET No results for input(s): "NA", "K", "CL", "CO2", "GLUCOSE", "BUN", "CREATININE", "CALCIUM" in the last 72 hours. PT/INR No results for input(s): "LABPROT", "INR" in the last 72 hours. CMP     Component Value Date/Time   NA 137 04/19/2021 1417   K 3.3 (L) 04/19/2021 1417   CL 104 04/19/2021 1417   CO2 25 04/19/2021 1417   GLUCOSE 90 04/19/2021 1417   BUN 14 04/19/2021 1417   CREATININE 0.70 04/19/2021 1417   CALCIUM 9.2 04/19/2021 1417   PROT 6.6 04/19/2021 1417   ALBUMIN 3.6 04/19/2021 1417   AST 18 04/19/2021 1417   ALT 17 04/19/2021 1417   ALKPHOS 39 04/19/2021 1417   BILITOT 0.6 04/19/2021 1417   GFRNONAA >60 04/19/2021 1417   GFRAA >60 05/23/2017 1218   Lipase  No results found for: "LIPASE"     Studies/Results: No results  found.  Anti-infectives: Anti-infectives (From admission, onward)    None        Assessment/Plan External hemorrhoids s/p delivery  The patient has some moderate sized external hemorrhoids, but none of these are thrombosed.  These should resolve with time and some further management.  We will add anusol cream and suppository so she can pick which one she thinks works best for her.  She can continue lidocaine gel as well if she would like.  We will add q 4 h sitz bathes and miralax to help keep her stools soft, but not too soft likely diarrhea.  I have given her our information so if these do not resolve on their own she can follow up with one of our colorectal surgeons as needed.  All questions answered.     LOS: 1 day    Henreitta Cea , [facility] 11/19/2021, 12:03 PM Please see Amion for pager number during day hours 7:00am-4:30pm or 7:00am -11:30am on weekends

## 2021-11-20 LAB — CBC
HCT: 32.2 % — ABNORMAL LOW (ref 36.0–46.0)
Hemoglobin: 10.7 g/dL — ABNORMAL LOW (ref 12.0–15.0)
MCH: 29.9 pg (ref 26.0–34.0)
MCHC: 33.2 g/dL (ref 30.0–36.0)
MCV: 89.9 fL (ref 80.0–100.0)
Platelets: 213 10*3/uL (ref 150–400)
RBC: 3.58 MIL/uL — ABNORMAL LOW (ref 3.87–5.11)
RDW: 12.3 % (ref 11.5–15.5)
WBC: 13.3 10*3/uL — ABNORMAL HIGH (ref 4.0–10.5)
nRBC: 0 % (ref 0.0–0.2)

## 2021-11-20 NOTE — Anesthesia Postprocedure Evaluation (Signed)
Anesthesia Post Note  Patient: Michelle Wilkins  Procedure(s) Performed: AN AD [place]     Patient location during evaluation: Mother Baby Anesthesia Type: Epidural Level of consciousness: awake and alert Pain management: pain level controlled Vital Signs Assessment: post-procedure vital signs reviewed and stable Respiratory status: spontaneous breathing, nonlabored ventilation and respiratory function stable Cardiovascular status: stable Postop Assessment: no headache, no backache and epidural receding Anesthetic complications: no   No notable events documented.  Last Vitals:  Vitals:   11/19/21 2141 11/20/21 0539  BP: 124/78 120/66  Pulse: 71 70  Resp: 18 18  Temp: 36.7 C 36.7 C  SpO2: 99% 99%    Last Pain:  Vitals:   11/20/21 1120  TempSrc:   PainSc: 0-No pain   Pain Goal:                   Ailene Ards

## 2021-11-20 NOTE — Lactation Note (Signed)
This note was copied from a baby's chart. Lactation Consultation Note  Patient Name: Michelle Wilkins [id number] Date: 11/20/2021 Reason for consult: Follow-up assessment;Term Age:35 hours   P2: Term infant at 39+4 weeks Feeding preference: Breast Weight loss: 4%  "[name]" was swaddled and asleep in visitor's arms when I arrived.  Birth parent has been latching and feeding well: LATCH scores of 8-9; "[name]" is voiding/stooling.  Birth parent had no specific questions/concerns at this time.  Family will be staying another night.  Engorgement prevention/treatment completed.  Birth parent will call as needed.   Maternal Data    Feeding Mother's Current Feeding Choice: Breast Milk  LATCH Score                    Lactation Tools Discussed/Used    Interventions    Discharge Discharge Education: Engorgement and breast care Pump: Personal  Consult Status Consult Status: Follow-up Date: 11/21/21 Follow-up type: In-patient    Kelden Lavallee R Siana Panameno 11/20/2021, 2:15 PM

## 2021-11-20 NOTE — Progress Notes (Signed)
Post Partum Day 1 Subjective: up ad lib, voiding, and tolerating PO  Hemorrhoids are improving slowly.  Baby working on feeds and having jaundice levels followed.   Objective: Blood pressure 120/66, pulse 70, temperature 98 F (36.7 C), temperature source Oral, resp. rate 18, height '5\' 4"'$  (1.626 m), weight 73.3 kg, last menstrual period 02/15/2021, SpO2 99 %, unknown if currently breastfeeding.  Physical Exam:  General: alert and cooperative Lochia: appropriate Uterine Fundus: firm   Recent Labs    11/18/21 2350 11/20/21 0603  HGB 12.0 10.7*  HCT 34.8* 32.2*    Assessment/Plan: Plan for discharge tomorrow   LOS: 2 days   Logan Bores, MD 11/20/2021, 12:33 PM

## 2021-11-21 MED ORDER — IBUPROFEN 600 MG PO TABS
600.0000 mg | ORAL_TABLET | Freq: Four times a day (QID) | ORAL | 0 refills | Status: DC
  Filled 2021-11-21: qty 30, 8d supply, fill #0

## 2021-11-21 MED ORDER — ACETAMINOPHEN 325 MG PO TABS
650.0000 mg | ORAL_TABLET | ORAL | 1 refills | Status: DC | PRN
  Filled 2021-11-21: qty 30, 3d supply, fill #0

## 2021-11-21 NOTE — Progress Notes (Signed)
MOB was referred for history of depression/anxiety. * Referral screened out by Clinical Social Worker because none of the following criteria appear to apply: ~ History of anxiety/depression during this pregnancy, or of post-partum depression following prior delivery. ~ Diagnosis of anxiety and/or depression within last 3 years OR * MOB's symptoms currently being treated with medication and/or therapy. MOB is prescribed Zoloft '25mg'$ .  Please contact the Clinical Social Worker if needs arise, by [facility] request, or if MOB scores greater than 9/yes to question 10 on Edinburgh Postpartum Depression Screen.  Signed,  Berniece Salines, MSW, LCSWA, LCASA 11/21/2021 9:00 AM

## 2021-11-21 NOTE — Progress Notes (Signed)
Post Partum Day 2 Subjective: no complaints, up ad lib, voiding, and tolerating PO  Objective: Blood pressure 120/70, pulse 64, temperature 98.6 F (37 C), temperature source Oral, resp. rate 17, height '5\' 4"'$  (1.626 m), weight 73.3 kg, last menstrual period 02/15/2021, SpO2 98 %, unknown if currently breastfeeding.  Physical Exam:  General: alert and cooperative Lochia: appropriate Uterine Fundus: firm   Recent Labs    11/18/21 2350 11/20/21 0603  HGB 12.0 10.7*  HCT 34.8* 32.2*    Assessment/Plan: Discharge home She has medications for hemorrhoids which are improving Routine pp f/u   LOS: 3 days   Logan Bores, MD 11/21/2021, 5:57 AM

## 2021-11-21 NOTE — Lactation Note (Signed)
This note was copied from a baby's chart. Lactation Consultation Note  Patient Name: Michelle Wilkins [id number] Date: 11/21/2021 Reason for consult: Follow-up assessment;Term Age:35 hours   LC Note:  Discharge teaching completed.  Allowed time for questions.  Reviewed feeding plan for after discharge.  Family has our op phone number for any further questions/concerns.   Maternal Data    Feeding Mother's Current Feeding Choice: Breast Milk  LATCH Score                    Lactation Tools Discussed/Used    Interventions    Discharge Discharge Education: Engorgement and breast care  Consult Status Consult Status: Complete Date: 11/21/21 Follow-up type: Call as needed    Michelle Wilkins 11/21/2021, 1:35 PM

## 2021-11-21 NOTE — Discharge Summary (Signed)
Postpartum Discharge Summary       Patient Name: Michelle Wilkins DOB: 1986/02/16 MRN: [id number]  Date of admission: 11/18/2021 Delivery date:11/19/2021  Delivering provider: Jerelyn Charles  Date of discharge: 11/21/2021  Admitting diagnosis: Normal pregnancy in multigravida in third trimester [Z34.83] Intrauterine pregnancy: [redacted]w[redacted]d    Secondary diagnosis:  Principal Problem:   Normal pregnancy in multigravida in third trimester  Additional problems: hemorrhoids    Discharge diagnosis: Term Pregnancy Delivered                                              Post partum procedures: none Augmentation: AROM and Pitocin Complications: None  Hospital course: Induction of Labor With Vaginal Delivery   36y.o. yo [id number] admitted to the hospital 11/18/2021 for induction of labor.  Indication for induction: Elective.  Patient had an uncomplicated labor course as follows: Membrane Rupture Time/Date: 4:38 AM ,11/19/2021   Delivery Method:Vaginal, Spontaneous  Episiotomy: None  Lacerations:  2nd degree  Details of delivery can be found in separate delivery note.  Patient had a routine postpartum course. Patient is discharged home 11/21/21.  Newborn Data: Birth date:11/19/2021  Birth time:6:39 AM  Gender:Female  Living status:Living  Apgars:9 ,9  Weight:2950 g     Physical exam  Vitals:   11/20/21 0539 11/20/21 1434 11/20/21 2100 11/21/21 0555  BP: 120/66 123/76 120/70 128/79  Pulse: 70 67 64 (!) 59  Resp: '18 16 17 16  '$ Temp: 98 F (36.7 C) 98.2 F (36.8 C) 98.6 F (37 C) 98 F (36.7 C)  TempSrc: Oral Oral Oral Oral  SpO2: 99% 100% 98% 99%  Weight:      Height:       General: alert and cooperative Lochia: appropriate Uterine Fundus: firm  Labs: Lab Results  Component Value Date   WBC 13.3 (H) 11/20/2021   HGB 10.7 (L) 11/20/2021   HCT 32.2 (L) 11/20/2021   MCV 89.9 11/20/2021   PLT 213 11/20/2021      Latest Ref Rng & Units 04/19/2021    2:17 PM  CMP   Glucose 70 - 99 mg/dL 90   BUN 6 - 20 mg/dL 14   Creatinine 0.44 - 1.00 mg/dL 0.70   Sodium 135 - 145 mmol/L 137   Potassium 3.5 - 5.1 mmol/L 3.3   Chloride 98 - 111 mmol/L 104   CO2 22 - 32 mmol/L 25   Calcium 8.9 - 10.3 mg/dL 9.2   Total Protein 6.5 - 8.1 g/dL 6.6   Total Bilirubin 0.3 - 1.2 mg/dL 0.6   Alkaline Phos 38 - 126 U/L 39   AST 15 - 41 U/L 18   ALT 0 - 44 U/L 17    Edinburgh Score:    11/20/2021    1:30 PM  Edinburgh Postnatal Depression Scale Screening Tool  I have been able to laugh and see the funny side of things. 0  I have looked forward with enjoyment to things. 0  I have blamed myself unnecessarily when things went wrong. 1  I have been anxious or worried for no good reason. 2  I have felt scared or panicky for no good reason. 1  Things have been getting on top of me. 1  I have been so unhappy that I have had difficulty sleeping. 0  I have felt sad or  miserable. 1  I have been so unhappy that I have been crying. 0  The thought of harming myself has occurred to me. 0  Edinburgh Postnatal Depression Scale Total 6     After visit meds:  Allergies as of 11/21/2021   No Known Allergies      Medication List     STOP taking these medications    glycopyrrolate 2 MG tablet Commonly known as: ROBINUL   metoCLOPramide 10 MG tablet Commonly known as: REGLAN   ondansetron 4 MG disintegrating tablet Commonly known as: ZOFRAN-ODT   promethazine 25 MG tablet Commonly known as: PHENERGAN       TAKE these medications    acetaminophen 325 MG tablet Commonly known as: Tylenol Take 2 tablets (650 mg total) by mouth every 4 (four) hours as needed (for pain scale < 4).   hydrocortisone 25 MG suppository Commonly known as: Anusol-HC Unwrap and Place 1 suppository (25 mg total) rectally 2 (two) times daily as directed for 14 days.   ibuprofen 600 MG tablet Commonly known as: ADVIL Take 1 tablet (600 mg total) by mouth every 6 (six) hours.    Lidocaine (Anorectal) 5 % Crea Commonly known as: RectiCare Apply topically 3 (three) times daily as needed for 20 days.   Linzess 72 MCG capsule Generic drug: linaclotide Take 1 capsule (72 mcg total) by mouth daily as directed for 30 days   prenatal multivitamin Tabs tablet Take 1 tablet by mouth daily at 12 noon.   Proctozone-HC 2.5 % rectal cream Generic drug: hydrocortisone Place rectally 3 (three) times daily. May use this or suppository, whichever works best for you   sertraline 25 MG tablet Commonly known as: ZOLOFT Take 1 tablet (25 mg total) by mouth at bedtime.         Discharge home in stable condition Infant Feeding: Breast Infant Disposition:home with mother Discharge instruction: per After Visit Summary and Postpartum booklet. Activity: Advance as tolerated. Pelvic rest for 6 weeks.  Diet: routine diet Future Appointments: Future Appointments  Date Time Provider [place]  12/20/2021  9:00 AM [facility] None  12/22/2021 10:15 AM Nicholas Lose, MD [facility] None   Follow up Visit:  Follow-up Information     Sur[facility], [place] Follow up today.   Specialty: General Surgery Why: As needed.  Follow up with Dr. Dema Severin or Dr. Marcello Moores as needed if symptoms don't resolve. Contact information: [address] [place] [address] [phone]         [facility], [facility]. Schedule an appointment as soon as possible for a visit in 6 week(s).   Why: Routine pp followup Contact information: [address]  [address]  [place] [address] [phone]                  Please schedule this patient for a In person postpartum visit in 6 weeks with the following provider: APP.  Delivery mode:  Vaginal, Spontaneous  Anticipated Birth Control:  POPs   11/21/2021 Logan Bores, MD

## 2021-11-22 ENCOUNTER — Other Ambulatory Visit (HOSPITAL_COMMUNITY): Payer: Self-pay

## 2021-11-26 ENCOUNTER — Other Ambulatory Visit (HOSPITAL_COMMUNITY): Payer: Self-pay

## 2021-11-29 ENCOUNTER — Other Ambulatory Visit (HOSPITAL_COMMUNITY): Payer: Self-pay

## 2021-11-29 ENCOUNTER — Telehealth (HOSPITAL_COMMUNITY): Payer: Self-pay

## 2021-11-29 MED ORDER — SERTRALINE HCL 25 MG PO TABS
25.0000 mg | ORAL_TABLET | Freq: Every day | ORAL | 1 refills | Status: DC
  Filled 2021-11-29: qty 30, 30d supply, fill #0
  Filled 2021-12-30: qty 30, 30d supply, fill #1

## 2021-11-29 NOTE — Telephone Encounter (Signed)
Patient did not answer phone call. Voicemail left for patient.   Sharyn Lull [facility] 11/29/2021,1837

## 2021-12-20 ENCOUNTER — Inpatient Hospital Stay: Payer: 59 | Attending: Hematology and Oncology

## 2021-12-22 ENCOUNTER — Inpatient Hospital Stay: Payer: 59 | Admitting: Hematology and Oncology

## 2021-12-22 NOTE — Assessment & Plan Note (Signed)
Lab review 04/26/2021: Hemoglobin electrophoresis: Normal hemoglobin, hemoglobin 11, MCV 86, platelets 338, iron saturation: 59%, ferritin 374, TIBC 299  (10 years ago she had hemochromatosis gene testing and was apparently diagnosed with hereditary hemochromatosis but she has not required any phlebotomies. Initially she did donate blood but over time [facility] refused to take her blood.)  Date of delivery 11/21/2021 Goal of hemochromatosis: Keep ferritin less than 100 Labs and follow-up in 2 months

## 2021-12-30 ENCOUNTER — Other Ambulatory Visit (HOSPITAL_COMMUNITY): Payer: Self-pay

## 2021-12-30 DIAGNOSIS — Z3041 Encounter for surveillance of contraceptive pills: Secondary | ICD-10-CM | POA: Diagnosis not present

## 2021-12-30 DIAGNOSIS — Z1331 Encounter for screening for depression: Secondary | ICD-10-CM | POA: Diagnosis not present

## 2021-12-30 DIAGNOSIS — O9123 Nonpurulent mastitis associated with lactation: Secondary | ICD-10-CM | POA: Diagnosis not present

## 2021-12-30 MED ORDER — DICLOXACILLIN SODIUM 500 MG PO CAPS
500.0000 mg | ORAL_CAPSULE | Freq: Three times a day (TID) | ORAL | 0 refills | Status: DC
  Filled 2021-12-30: qty 21, 7d supply, fill #0

## 2021-12-30 MED ORDER — NORETHINDRONE 0.35 MG PO TABS
1.0000 | ORAL_TABLET | Freq: Every day | ORAL | 4 refills | Status: DC
  Filled 2021-12-30: qty 84, 84d supply, fill #0
  Filled 2022-03-12: qty 84, 84d supply, fill #1
  Filled 2022-06-17: qty 84, 84d supply, fill #2
  Filled 2022-07-21 – 2022-08-20 (×2): qty 84, 84d supply, fill #3
  Filled 2022-12-16: qty 84, 84d supply, fill #4

## 2021-12-31 ENCOUNTER — Other Ambulatory Visit (HOSPITAL_COMMUNITY): Payer: Self-pay

## 2022-01-02 NOTE — Progress Notes (Signed)
HEMATOLOGY-ONCOLOGY TELEPHONE VISIT PROGRESS NOTE  I connected with our patient on 01/06/22 at  8:00 AM EST by telephone and verified that I am speaking with the correct person using two identifiers.  I discussed the limitations, risks, security and privacy concerns of performing an evaluation and management service by telephone and the availability of in person appointments.  I also discussed with the patient that there may be a patient responsible charge related to this service. The patient expressed understanding and agreed to proceed.   History of Present Illness: Recent delivery of baby girl [place] on 11/19/2021. The purpose of this visit is to review the results of the recent blood work.  REVIEW OF SYSTEMS:   Constitutional: Denies fevers, chills or abnormal weight loss All other systems were reviewed with the patient and are negative. Observations/Objective:     Assessment Plan:  Hemochromatosis Current pregnancy Lab review 04/26/2021: Hemoglobin electrophoresis: Normal hemoglobin, hemoglobin 11, MCV 86, platelets 338, iron saturation: 59%, ferritin 374, TIBC 299 08/26/2021:Hemoglobin 10.8, iron saturation 42%, ferritin 85 01/03/2022: Ferritin 103, iron saturation 48%, hemoglobin 12.8   10 years ago she had hemochromatosis gene testing and was apparently diagnosed with hereditary hemochromatosis but she has not required any phlebotomies.  Initially she did donate blood but over time [facility] refused to take her blood.   Date of delivery: 11/19/2021 [name]) Hemochromatosis goal: To keep ferritin below 100  I discussed with the patient that the iron saturation and ferritin are starting to creep up.  This could be because of lack of menstrual cycle since delivery.  I expect that it to come down as she gets monthly menstrual cycles back again. We decided to watch and monitor for couple more months and recheck her labs in January and do a telephone visit after that to make a decision  regarding phlebotomy treatments.  I discussed the assessment and treatment plan with the patient. The patient was provided an opportunity to ask questions and all were answered. The patient agreed with the plan and demonstrated an understanding of the instructions. The patient was advised to call back or seek an in-person evaluation if the symptoms worsen or if the condition fails to improve as anticipated.   I provided 12 minutes of non-face-to-face time during this encounter.  This includes time for charting and coordination of care   Harriette Ohara, MD  I Gardiner Coins am scribing for Dr. Lindi Adie  I have reviewed the above documentation for accuracy and completeness, and I agree with the above.

## 2022-01-03 ENCOUNTER — Other Ambulatory Visit: Payer: Self-pay

## 2022-01-03 ENCOUNTER — Inpatient Hospital Stay: Payer: 59 | Attending: Hematology and Oncology

## 2022-01-03 LAB — CBC WITH DIFFERENTIAL (CANCER CENTER ONLY)
Abs Immature Granulocytes: 0.01 10*3/uL (ref 0.00–0.07)
Basophils Absolute: 0.1 10*3/uL (ref 0.0–0.1)
Basophils Relative: 1 %
Eosinophils Absolute: 0.2 10*3/uL (ref 0.0–0.5)
Eosinophils Relative: 3 %
HCT: 39.6 % (ref 36.0–46.0)
Hemoglobin: 12.8 g/dL (ref 12.0–15.0)
Immature Granulocytes: 0 %
Lymphocytes Relative: 30 %
Lymphs Abs: 1.9 10*3/uL (ref 0.7–4.0)
MCH: 28.6 pg (ref 26.0–34.0)
MCHC: 32.3 g/dL (ref 30.0–36.0)
MCV: 88.6 fL (ref 80.0–100.0)
Monocytes Absolute: 0.5 10*3/uL (ref 0.1–1.0)
Monocytes Relative: 7 %
Neutro Abs: 3.6 10*3/uL (ref 1.7–7.7)
Neutrophils Relative %: 59 %
Platelet Count: 317 10*3/uL (ref 150–400)
RBC: 4.47 MIL/uL (ref 3.87–5.11)
RDW: 11.4 % — ABNORMAL LOW (ref 11.5–15.5)
WBC Count: 6.2 10*3/uL (ref 4.0–10.5)
nRBC: 0 % (ref 0.0–0.2)

## 2022-01-03 LAB — FERRITIN: Ferritin: 103 ng/mL (ref 11–307)

## 2022-01-03 LAB — IRON AND IRON BINDING CAPACITY (CC-WL,HP ONLY)
Iron: 127 ug/dL (ref 28–170)
Saturation Ratios: 48 % — ABNORMAL HIGH (ref 10.4–31.8)
TIBC: 266 ug/dL (ref 250–450)
UIBC: 139 ug/dL — ABNORMAL LOW (ref 148–442)

## 2022-01-06 ENCOUNTER — Inpatient Hospital Stay (HOSPITAL_BASED_OUTPATIENT_CLINIC_OR_DEPARTMENT_OTHER): Payer: 59 | Admitting: Hematology and Oncology

## 2022-01-06 NOTE — Assessment & Plan Note (Signed)
Current pregnancy Lab review 04/26/2021: Hemoglobin electrophoresis: Normal hemoglobin, hemoglobin 11, MCV 86, platelets 338, iron saturation: 59%, ferritin 374, TIBC 299 08/26/2021:Hemoglobin 10.8, iron saturation 42%, ferritin 85 01/03/2022: Ferritin 103, iron saturation 48%, hemoglobin 12.8   10 years ago she had hemochromatosis gene testing and was apparently diagnosed with hereditary hemochromatosis but she has not required any phlebotomies.  Initially she did donate blood but over time [facility] refused to take her blood.   Date of delivery: 11/19/2021 Hemochromatosis goal: To keep ferritin below 100  I discussed with the patient that the iron saturation and ferritin are starting to creep up.  This could be because of lack of menstrual cycle since delivery.  I expect that it to come down as she gets monthly menstrual cycles back again.

## 2022-01-10 ENCOUNTER — Telehealth: Payer: Self-pay | Admitting: Hematology and Oncology

## 2022-01-10 LAB — HEMOCHROMATOSIS DNA-PCR(C282Y,H63D)

## 2022-01-10 NOTE — Telephone Encounter (Signed)
Scheduled appointment per 11/16 los. Left voicemail.

## 2022-02-03 ENCOUNTER — Other Ambulatory Visit (HOSPITAL_COMMUNITY): Payer: Self-pay

## 2022-02-03 MED ORDER — SERTRALINE HCL 25 MG PO TABS
25.0000 mg | ORAL_TABLET | Freq: Every day | ORAL | 1 refills | Status: DC
  Filled 2022-02-03: qty 30, 30d supply, fill #0
  Filled 2022-03-12: qty 30, 30d supply, fill #1

## 2022-02-18 ENCOUNTER — Telehealth: Payer: Self-pay | Admitting: Hematology and Oncology

## 2022-02-18 NOTE — Telephone Encounter (Signed)
Rescheduled appointment per provider PAL. Left voicemail. 

## 2022-03-03 ENCOUNTER — Inpatient Hospital Stay: Payer: Commercial Managed Care - PPO

## 2022-03-07 ENCOUNTER — Telehealth: Payer: Self-pay | Admitting: Hematology and Oncology

## 2022-03-09 ENCOUNTER — Telehealth: Payer: Self-pay | Admitting: Hematology and Oncology

## 2022-03-10 ENCOUNTER — Other Ambulatory Visit: Payer: Self-pay

## 2022-03-10 ENCOUNTER — Inpatient Hospital Stay: Payer: Commercial Managed Care - PPO | Attending: Hematology and Oncology

## 2022-03-10 LAB — CMP (CANCER CENTER ONLY)
ALT: 16 U/L (ref 0–44)
AST: 18 U/L (ref 15–41)
Albumin: 4.2 g/dL (ref 3.5–5.0)
Alkaline Phosphatase: 68 U/L (ref 38–126)
Anion gap: 8 (ref 5–15)
BUN: 21 mg/dL — ABNORMAL HIGH (ref 6–20)
CO2: 25 mmol/L (ref 22–32)
Calcium: 9.4 mg/dL (ref 8.9–10.3)
Chloride: 109 mmol/L (ref 98–111)
Creatinine: 0.8 mg/dL (ref 0.44–1.00)
GFR, Estimated: >60 mL/min (ref >60)
Glucose, Bld: 114 mg/dL — ABNORMAL HIGH (ref 70–99)
Potassium: 3.9 mmol/L (ref 3.5–5.1)
Sodium: 142 mmol/L (ref 135–145)
Total Bilirubin: 0.5 mg/dL (ref 0.3–1.2)
Total Protein: 6.9 g/dL (ref 6.5–8.1)

## 2022-03-10 LAB — CBC WITH DIFFERENTIAL (CANCER CENTER ONLY)
Abs Immature Granulocytes: 0.01 10*3/uL (ref 0.00–0.07)
Basophils Absolute: 0 10*3/uL (ref 0.0–0.1)
Basophils Relative: 1 %
Eosinophils Absolute: 0.1 10*3/uL (ref 0.0–0.5)
Eosinophils Relative: 2 %
HCT: 35.5 % — ABNORMAL LOW (ref 36.0–46.0)
Hemoglobin: 11.5 g/dL — ABNORMAL LOW (ref 12.0–15.0)
Immature Granulocytes: 0 %
Lymphocytes Relative: 39 %
Lymphs Abs: 2 10*3/uL (ref 0.7–4.0)
MCH: 28.5 pg (ref 26.0–34.0)
MCHC: 32.4 g/dL (ref 30.0–36.0)
MCV: 87.9 fL (ref 80.0–100.0)
Monocytes Absolute: 0.4 10*3/uL (ref 0.1–1.0)
Monocytes Relative: 7 %
Neutro Abs: 2.6 10*3/uL (ref 1.7–7.7)
Neutrophils Relative %: 51 %
Platelet Count: 274 10*3/uL (ref 150–400)
RBC: 4.04 MIL/uL (ref 3.87–5.11)
RDW: 11.9 % (ref 11.5–15.5)
WBC Count: 5.1 10*3/uL (ref 4.0–10.5)
nRBC: 0 % (ref 0.0–0.2)

## 2022-03-10 LAB — IRON AND IRON BINDING CAPACITY (CC-WL,HP ONLY)
Iron: 158 ug/dL (ref 28–170)
Saturation Ratios: 62 % — ABNORMAL HIGH (ref 10.4–31.8)
TIBC: 255 ug/dL (ref 250–450)
UIBC: 97 ug/dL — ABNORMAL LOW (ref 148–442)

## 2022-03-10 LAB — FERRITIN: Ferritin: 104 ng/mL (ref 11–307)

## 2022-03-14 ENCOUNTER — Other Ambulatory Visit: Payer: Self-pay

## 2022-03-15 NOTE — Assessment & Plan Note (Signed)
Delivery: 11/19/21 Lab review 04/26/2021: Hemoglobin electrophoresis: Normal hemoglobin, hemoglobin 11, MCV 86, platelets 338, iron saturation: 59%, ferritin 374, TIBC 299 08/26/2021:Hemoglobin 10.8, iron saturation 42%, ferritin 85 01/03/2022: Ferritin 103, iron saturation 48%, hemoglobin 12.8 03/10/22: Hb 11.5, Iron Sat: 62%, Ferritin 104   10 years ago she had hemochromatosis gene testing and was apparently diagnosed with hereditary hemochromatosis but she has not required any phlebotomies.  Initially she did donate blood but over time [facility] refused to take her blood.   Date of delivery: 11/19/2021 [name]) Hemochromatosis goal: To keep ferritin below 100

## 2022-03-16 ENCOUNTER — Inpatient Hospital Stay (HOSPITAL_BASED_OUTPATIENT_CLINIC_OR_DEPARTMENT_OTHER): Payer: Commercial Managed Care - PPO | Admitting: Hematology and Oncology

## 2022-03-16 NOTE — Progress Notes (Signed)
HEMATOLOGY-ONCOLOGY TELEPHONE VISIT PROGRESS NOTE  I connected with our patient on 03/16/22 at 10:00 AM EST by telephone and verified that I am speaking with the correct person using two identifiers.  I discussed the limitations, risks, security and privacy concerns of performing an evaluation and management service by telephone and the availability of in person appointments.  I also discussed with the patient that there may be a patient responsible charge related to this service. The patient expressed understanding and agreed to proceed.   History of Present Illness: Michelle Wilkins 37 y.o. female is here because of recent diagnosis of hemochromatosis. She presents to the clinic today for telephone follow-up.    REVIEW OF SYSTEMS:   Constitutional: Denies fevers, chills or abnormal weight loss All other systems were reviewed with the patient and are negative. Observations/Objective:     Assessment Plan:  Hemochromatosis Delivery: 11/19/21 Lab review 04/26/2021: Hemoglobin electrophoresis: Normal hemoglobin, hemoglobin 11, MCV 86, platelets 338, iron saturation: 59%, ferritin 374, TIBC 299 08/26/2021:Hemoglobin 10.8, iron saturation 42%, ferritin 85 01/03/2022: Ferritin 103, iron saturation 48%, hemoglobin 12.8 03/10/22: Hb 11.5, Iron Sat: 62%, Ferritin 104   10 years ago she had hemochromatosis gene testing and was apparently diagnosed with hereditary hemochromatosis but she has not required any phlebotomies.  Initially she did donate blood but over time [facility] refused to take her blood. I instructed her to contact [facility] and see if they would accept her blood. Her menstrual cycles have not resumed.  Once they are resumed and her iron saturation may come down naturally. She will call us if [facility] cannot do the phlebotomy so that we can schedule it in our clinic.   Date of delivery: 11/19/2021 [name]) Hemochromatosis goal: To keep ferritin below 100  I discussed the assessment and  treatment plan with the patient. The patient was provided an opportunity to ask questions and all were answered. The patient agreed with the plan and demonstrated an understanding of the instructions. The patient was advised to call back or seek an in-person evaluation if the symptoms worsen or if the condition fails to improve as anticipated.   I provided 12 minutes of non-face-to-face time during this encounter.  This includes time for charting and coordination of care   [place], CMA  I Yordan Martindale, Jayziah Bankhead am acting as a Education administrator for [employer]  I have reviewed the above documentation for accuracy and completeness, and I agree with the above.

## 2022-03-30 ENCOUNTER — Other Ambulatory Visit (HOSPITAL_COMMUNITY): Payer: Self-pay

## 2022-03-30 DIAGNOSIS — L814 Other melanin hyperpigmentation: Secondary | ICD-10-CM | POA: Diagnosis not present

## 2022-03-30 DIAGNOSIS — D2272 Melanocytic nevi of left lower limb, including hip: Secondary | ICD-10-CM | POA: Diagnosis not present

## 2022-03-30 DIAGNOSIS — D225 Melanocytic nevi of trunk: Secondary | ICD-10-CM | POA: Diagnosis not present

## 2022-03-30 DIAGNOSIS — L218 Other seborrheic dermatitis: Secondary | ICD-10-CM | POA: Diagnosis not present

## 2022-03-30 DIAGNOSIS — D2262 Melanocytic nevi of left upper limb, including shoulder: Secondary | ICD-10-CM | POA: Diagnosis not present

## 2022-03-30 DIAGNOSIS — L821 Other seborrheic keratosis: Secondary | ICD-10-CM | POA: Diagnosis not present

## 2022-03-30 MED ORDER — KETOCONAZOLE 2 % EX SHAM
MEDICATED_SHAMPOO | CUTANEOUS | 5 refills | Status: AC
  Filled 2022-03-30: qty 120, 30d supply, fill #0
  Filled 2022-05-03 – 2022-05-15 (×2): qty 120, 30d supply, fill #1
  Filled 2022-06-17: qty 120, 30d supply, fill #2

## 2022-03-31 ENCOUNTER — Telehealth: Payer: Self-pay | Admitting: Hematology and Oncology

## 2022-03-31 NOTE — Telephone Encounter (Signed)
Scheduled appointment per 1/24 los. Left voicemail.

## 2022-04-12 ENCOUNTER — Other Ambulatory Visit (HOSPITAL_COMMUNITY): Payer: Self-pay

## 2022-04-13 ENCOUNTER — Other Ambulatory Visit (HOSPITAL_COMMUNITY): Payer: Self-pay

## 2022-04-13 MED ORDER — SERTRALINE HCL 25 MG PO TABS
25.0000 mg | ORAL_TABLET | Freq: Every day | ORAL | 1 refills | Status: DC
  Filled 2022-04-13: qty 30, 30d supply, fill #0
  Filled 2022-05-15: qty 30, 30d supply, fill #1

## 2022-04-15 ENCOUNTER — Other Ambulatory Visit (HOSPITAL_COMMUNITY): Payer: Self-pay

## 2022-04-15 MED ORDER — NIFEDIPINE 10 MG PO CAPS
10.0000 mg | ORAL_CAPSULE | Freq: Four times a day (QID) | ORAL | 2 refills | Status: DC | PRN
  Filled 2022-04-15: qty 60, 15d supply, fill #0

## 2022-04-18 ENCOUNTER — Other Ambulatory Visit (HOSPITAL_COMMUNITY): Payer: Self-pay

## 2022-05-04 ENCOUNTER — Other Ambulatory Visit (HOSPITAL_COMMUNITY): Payer: Self-pay

## 2022-05-04 ENCOUNTER — Other Ambulatory Visit: Payer: Self-pay

## 2022-05-05 ENCOUNTER — Other Ambulatory Visit: Payer: Self-pay

## 2022-05-09 ENCOUNTER — Other Ambulatory Visit: Payer: Self-pay

## 2022-05-17 ENCOUNTER — Other Ambulatory Visit (HOSPITAL_COMMUNITY): Payer: Self-pay

## 2022-06-01 DIAGNOSIS — Z3482 Encounter for supervision of other normal pregnancy, second trimester: Secondary | ICD-10-CM | POA: Diagnosis not present

## 2022-06-01 DIAGNOSIS — Z3483 Encounter for supervision of other normal pregnancy, third trimester: Secondary | ICD-10-CM | POA: Diagnosis not present

## 2022-06-02 DIAGNOSIS — H52223 Regular astigmatism, bilateral: Secondary | ICD-10-CM | POA: Diagnosis not present

## 2022-06-15 ENCOUNTER — Inpatient Hospital Stay: Payer: Commercial Managed Care - PPO | Attending: Hematology and Oncology

## 2022-06-15 ENCOUNTER — Other Ambulatory Visit: Payer: Self-pay

## 2022-06-15 LAB — CMP (CANCER CENTER ONLY)
ALT: 14 U/L (ref 0–44)
AST: 19 U/L (ref 15–41)
Albumin: 4.6 g/dL (ref 3.5–5.0)
Alkaline Phosphatase: 81 U/L (ref 38–126)
Anion gap: 7 (ref 5–15)
BUN: 22 mg/dL — ABNORMAL HIGH (ref 6–20)
CO2: 29 mmol/L (ref 22–32)
Calcium: 9.5 mg/dL (ref 8.9–10.3)
Chloride: 105 mmol/L (ref 98–111)
Creatinine: 0.89 mg/dL (ref 0.44–1.00)
GFR, Estimated: >60 mL/min (ref >60)
Glucose, Bld: 130 mg/dL — ABNORMAL HIGH (ref 70–99)
Potassium: 3.9 mmol/L (ref 3.5–5.1)
Sodium: 141 mmol/L (ref 135–145)
Total Bilirubin: 0.8 mg/dL (ref 0.3–1.2)
Total Protein: 7.7 g/dL (ref 6.5–8.1)

## 2022-06-15 LAB — FERRITIN: Ferritin: 99 ng/mL (ref 11–307)

## 2022-06-15 LAB — CBC WITH DIFFERENTIAL (CANCER CENTER ONLY)
Abs Immature Granulocytes: 0.01 10*3/uL (ref 0.00–0.07)
Basophils Absolute: 0.1 10*3/uL (ref 0.0–0.1)
Basophils Relative: 1 %
Eosinophils Absolute: 0.1 10*3/uL (ref 0.0–0.5)
Eosinophils Relative: 2 %
HCT: 41 % (ref 36.0–46.0)
Hemoglobin: 12.8 g/dL (ref 12.0–15.0)
Immature Granulocytes: 0 %
Lymphocytes Relative: 31 %
Lymphs Abs: 1.6 10*3/uL (ref 0.7–4.0)
MCH: 27.5 pg (ref 26.0–34.0)
MCHC: 31.2 g/dL (ref 30.0–36.0)
MCV: 88 fL (ref 80.0–100.0)
Monocytes Absolute: 0.3 10*3/uL (ref 0.1–1.0)
Monocytes Relative: 5 %
Neutro Abs: 3.2 10*3/uL (ref 1.7–7.7)
Neutrophils Relative %: 61 %
Platelet Count: 323 10*3/uL (ref 150–400)
RBC: 4.66 MIL/uL (ref 3.87–5.11)
RDW: 11.6 % (ref 11.5–15.5)
WBC Count: 5.2 10*3/uL (ref 4.0–10.5)
nRBC: 0 % (ref 0.0–0.2)

## 2022-06-15 LAB — IRON AND IRON BINDING CAPACITY (CC-WL,HP ONLY)
Iron: 169 ug/dL (ref 28–170)
Saturation Ratios: 61 % — ABNORMAL HIGH (ref 10.4–31.8)
TIBC: 277 ug/dL (ref 250–450)
UIBC: 108 ug/dL — ABNORMAL LOW (ref 148–442)

## 2022-06-17 ENCOUNTER — Other Ambulatory Visit (HOSPITAL_COMMUNITY): Payer: Self-pay

## 2022-06-19 NOTE — Assessment & Plan Note (Signed)
Delivery: 11/19/21 Lab review 04/26/2021: Hemoglobin electrophoresis: Normal hemoglobin, hemoglobin 11, MCV 86, platelets 338, iron saturation: 59%, ferritin 374, TIBC 299 08/26/2021:Hemoglobin 10.8, iron saturation 42%, ferritin 85 01/03/2022: Ferritin 103, iron saturation 48%, hemoglobin 12.8 03/10/22: Hb 11.5, Iron Sat: 62%, Ferritin 104   10 years ago she had hemochromatosis gene testing and was apparently diagnosed with hereditary hemochromatosis but she has not required any phlebotomies.  Date of delivery: 11/19/2021 Zollie Scale) Hemochromatosis goal: To keep ferritin below 100

## 2022-06-20 ENCOUNTER — Other Ambulatory Visit: Payer: Self-pay

## 2022-06-20 ENCOUNTER — Inpatient Hospital Stay (HOSPITAL_BASED_OUTPATIENT_CLINIC_OR_DEPARTMENT_OTHER): Payer: Commercial Managed Care - PPO | Admitting: Hematology and Oncology

## 2022-06-20 ENCOUNTER — Other Ambulatory Visit (HOSPITAL_COMMUNITY): Payer: Self-pay

## 2022-06-20 MED ORDER — SERTRALINE HCL 25 MG PO TABS
25.0000 mg | ORAL_TABLET | Freq: Every day | ORAL | 1 refills | Status: DC
  Filled 2022-06-20: qty 30, 30d supply, fill #0
  Filled 2022-07-21: qty 30, 30d supply, fill #1

## 2022-06-20 NOTE — Progress Notes (Signed)
HEMATOLOGY-ONCOLOGY TELEPHONE VISIT PROGRESS NOTE  I connected with our patient on 06/20/22 at  8:30 AM EDT by telephone and verified that I am speaking with the correct person using two identifiers.  I discussed the limitations, risks, security and privacy concerns of performing an evaluation and management service by telephone and the availability of in person appointments.  I also discussed with the patient that there may be a patient responsible charge related to this service. The patient expressed understanding and agreed to proceed.   History of Present Illness:  [name] 37 y.o. female is here because of recent diagnosis of hemochromatosis. She presents to the clinic today for telephone follow-up to review labs.  She denies any new complaints or concerns.    REVIEW OF SYSTEMS:   Constitutional: Denies fevers, chills or abnormal weight loss All other systems were reviewed with the patient and are negative. Observations/Objective:    Assessment Plan:  Hemochromatosis Delivery: 11/19/21 Lab review 04/26/2021: Hemoglobin electrophoresis: Normal hemoglobin, hemoglobin 11, MCV 86, platelets 338, iron saturation: 59%, ferritin 374, TIBC 299 08/26/2021:Hemoglobin 10.8, iron saturation 42%, ferritin 85 01/03/2022: Ferritin 103, iron saturation 48%, hemoglobin 12.8 03/10/22: Hb 11.5, Iron Sat: 62%, Ferritin 104 06/15/22: Hemoglobin 12.8, iron saturation 61%, ferritin 99, LFTs normal   10 years ago she had hemochromatosis gene testing and was apparently diagnosed with hereditary hemochromatosis but she has not required any phlebotomies.  Date of delivery: 11/19/2021 Michelle Wilkins) Hemochromatosis goal: To keep ferritin below 100 Since the ferritin is below 100 without any change in LFTs, we decided to watch and monitor. Her cycles have not resumed since the delivery. Return to clinic in 4 months for labs and telephone visit 2 days later.  I discussed the assessment and treatment plan with the  patient. The patient was provided an opportunity to ask questions and all were answered. The patient agreed with the plan and demonstrated an understanding of the instructions. The patient was advised to call back or seek an in-person evaluation if the symptoms worsen or if the condition fails to improve as anticipated.   I provided 12 minutes of non-face-to-face time during this encounter.  This includes time for charting and coordination of care   Michelle Meek, MD  I Janan Ridge am acting as a scribe for Dr.Akiva Brassfield  I have reviewed the above documentation for accuracy and completeness, and I agree with the above.

## 2022-06-30 DIAGNOSIS — Z0000 Encounter for general adult medical examination without abnormal findings: Secondary | ICD-10-CM | POA: Diagnosis not present

## 2022-06-30 DIAGNOSIS — Z13220 Encounter for screening for lipoid disorders: Secondary | ICD-10-CM | POA: Diagnosis not present

## 2022-07-22 ENCOUNTER — Other Ambulatory Visit (HOSPITAL_COMMUNITY): Payer: Self-pay

## 2022-07-22 ENCOUNTER — Other Ambulatory Visit: Payer: Self-pay

## 2022-07-28 ENCOUNTER — Other Ambulatory Visit (HOSPITAL_COMMUNITY): Payer: Self-pay

## 2022-08-20 ENCOUNTER — Other Ambulatory Visit (HOSPITAL_COMMUNITY): Payer: Self-pay

## 2022-08-22 ENCOUNTER — Other Ambulatory Visit (HOSPITAL_COMMUNITY): Payer: Self-pay

## 2022-08-22 MED ORDER — SERTRALINE HCL 25 MG PO TABS
25.0000 mg | ORAL_TABLET | Freq: Every day | ORAL | 1 refills | Status: DC
  Filled 2022-08-22: qty 30, 30d supply, fill #0
  Filled 2022-09-05 – 2022-10-11 (×2): qty 30, 30d supply, fill #1

## 2022-08-24 ENCOUNTER — Other Ambulatory Visit: Payer: Self-pay

## 2022-08-26 ENCOUNTER — Other Ambulatory Visit (HOSPITAL_COMMUNITY): Payer: Self-pay

## 2022-09-06 ENCOUNTER — Other Ambulatory Visit: Payer: Self-pay

## 2022-09-21 NOTE — Progress Notes (Signed)
This encounter was created in error - please disregard.

## 2022-11-09 ENCOUNTER — Other Ambulatory Visit (HOSPITAL_COMMUNITY): Payer: Self-pay

## 2022-11-10 ENCOUNTER — Other Ambulatory Visit (HOSPITAL_COMMUNITY): Payer: Self-pay

## 2022-11-10 MED ORDER — SERTRALINE HCL 25 MG PO TABS
25.0000 mg | ORAL_TABLET | Freq: Every day | ORAL | 1 refills | Status: DC
  Filled 2022-11-10: qty 30, 30d supply, fill #0
  Filled 2022-12-16: qty 30, 30d supply, fill #1

## 2022-12-29 DIAGNOSIS — R6882 Decreased libido: Secondary | ICD-10-CM | POA: Diagnosis not present

## 2022-12-29 DIAGNOSIS — Z01411 Encounter for gynecological examination (general) (routine) with abnormal findings: Secondary | ICD-10-CM | POA: Diagnosis not present

## 2022-12-29 DIAGNOSIS — Z1389 Encounter for screening for other disorder: Secondary | ICD-10-CM | POA: Diagnosis not present

## 2022-12-29 DIAGNOSIS — F32A Depression, unspecified: Secondary | ICD-10-CM | POA: Diagnosis not present

## 2022-12-29 DIAGNOSIS — Z130 Encounter for screening for diseases of the blood and blood-forming organs and certain disorders involving the immune mechanism: Secondary | ICD-10-CM | POA: Diagnosis not present

## 2023-01-16 ENCOUNTER — Other Ambulatory Visit (HOSPITAL_COMMUNITY): Payer: Self-pay

## 2023-01-16 MED ORDER — SERTRALINE HCL 25 MG PO TABS
25.0000 mg | ORAL_TABLET | Freq: Two times a day (BID) | ORAL | 0 refills | Status: DC
  Filled 2023-01-16: qty 30, 15d supply, fill #0

## 2023-01-31 ENCOUNTER — Other Ambulatory Visit (HOSPITAL_COMMUNITY): Payer: Self-pay

## 2023-01-31 DIAGNOSIS — F32A Depression, unspecified: Secondary | ICD-10-CM | POA: Diagnosis not present

## 2023-01-31 DIAGNOSIS — N644 Mastodynia: Secondary | ICD-10-CM | POA: Diagnosis not present

## 2023-01-31 MED ORDER — SERTRALINE HCL 25 MG PO TABS
50.0000 mg | ORAL_TABLET | Freq: Every day | ORAL | 3 refills | Status: DC
  Filled 2023-01-31: qty 60, 30d supply, fill #0
  Filled 2023-03-13: qty 60, 30d supply, fill #1
  Filled 2023-04-13: qty 60, 30d supply, fill #2
  Filled 2023-05-29: qty 60, 30d supply, fill #3

## 2023-03-13 ENCOUNTER — Other Ambulatory Visit: Payer: Self-pay

## 2023-03-13 ENCOUNTER — Other Ambulatory Visit (HOSPITAL_COMMUNITY): Payer: Self-pay

## 2023-03-13 MED ORDER — NORETHINDRONE 0.35 MG PO TABS
1.0000 | ORAL_TABLET | Freq: Every day | ORAL | 3 refills | Status: AC
  Filled 2023-03-13: qty 84, 84d supply, fill #0
  Filled 2023-05-29: qty 84, 84d supply, fill #1
  Filled 2023-09-12: qty 84, 84d supply, fill #2
  Filled 2024-01-04: qty 84, 84d supply, fill #3

## 2023-04-06 DIAGNOSIS — L821 Other seborrheic keratosis: Secondary | ICD-10-CM | POA: Diagnosis not present

## 2023-04-06 DIAGNOSIS — L218 Other seborrheic dermatitis: Secondary | ICD-10-CM | POA: Diagnosis not present

## 2023-04-06 DIAGNOSIS — L814 Other melanin hyperpigmentation: Secondary | ICD-10-CM | POA: Diagnosis not present

## 2023-04-06 DIAGNOSIS — D225 Melanocytic nevi of trunk: Secondary | ICD-10-CM | POA: Diagnosis not present

## 2023-04-13 ENCOUNTER — Telehealth: Payer: 59 | Admitting: Physician Assistant

## 2023-04-13 DIAGNOSIS — J019 Acute sinusitis, unspecified: Secondary | ICD-10-CM

## 2023-04-13 DIAGNOSIS — B9689 Other specified bacterial agents as the cause of diseases classified elsewhere: Secondary | ICD-10-CM

## 2023-04-13 MED ORDER — AMOXICILLIN-POT CLAVULANATE 875-125 MG PO TABS
1.0000 | ORAL_TABLET | Freq: Two times a day (BID) | ORAL | 0 refills | Status: AC
Start: 2023-04-13 — End: ?

## 2023-04-13 NOTE — Progress Notes (Signed)

## 2023-07-04 DIAGNOSIS — Z0000 Encounter for general adult medical examination without abnormal findings: Secondary | ICD-10-CM | POA: Diagnosis not present

## 2023-07-04 DIAGNOSIS — F411 Generalized anxiety disorder: Secondary | ICD-10-CM | POA: Diagnosis not present

## 2023-07-04 DIAGNOSIS — R5383 Other fatigue: Secondary | ICD-10-CM | POA: Diagnosis not present

## 2023-07-04 DIAGNOSIS — Z13220 Encounter for screening for lipoid disorders: Secondary | ICD-10-CM | POA: Diagnosis not present

## 2023-07-19 ENCOUNTER — Other Ambulatory Visit (HOSPITAL_COMMUNITY): Payer: Self-pay

## 2023-07-19 MED ORDER — SERTRALINE HCL 25 MG PO TABS
50.0000 mg | ORAL_TABLET | Freq: Every evening | ORAL | 1 refills | Status: AC
  Filled 2023-07-19: qty 60, 30d supply, fill #0

## 2023-08-01 ENCOUNTER — Other Ambulatory Visit (HOSPITAL_COMMUNITY): Payer: Self-pay

## 2023-08-29 DIAGNOSIS — H52223 Regular astigmatism, bilateral: Secondary | ICD-10-CM | POA: Diagnosis not present

## 2024-01-04 ENCOUNTER — Other Ambulatory Visit (HOSPITAL_COMMUNITY): Payer: Self-pay

## 2024-01-05 ENCOUNTER — Other Ambulatory Visit: Payer: Self-pay

## 2024-01-10 ENCOUNTER — Other Ambulatory Visit (HOSPITAL_COMMUNITY): Payer: Self-pay

## 2024-01-31 ENCOUNTER — Other Ambulatory Visit (HOSPITAL_COMMUNITY): Payer: Self-pay

## 2024-01-31 DIAGNOSIS — R051 Acute cough: Secondary | ICD-10-CM | POA: Diagnosis not present

## 2024-01-31 DIAGNOSIS — M545 Low back pain, unspecified: Secondary | ICD-10-CM | POA: Diagnosis not present

## 2024-01-31 DIAGNOSIS — J029 Acute pharyngitis, unspecified: Secondary | ICD-10-CM | POA: Diagnosis not present

## 2024-01-31 DIAGNOSIS — J0191 Acute recurrent sinusitis, unspecified: Secondary | ICD-10-CM | POA: Diagnosis not present

## 2024-01-31 DIAGNOSIS — J020 Streptococcal pharyngitis: Secondary | ICD-10-CM | POA: Diagnosis not present

## 2024-01-31 MED ORDER — BENZONATATE 200 MG PO CAPS
200.0000 mg | ORAL_CAPSULE | Freq: Three times a day (TID) | ORAL | 0 refills | Status: AC | PRN
  Filled 2024-01-31: qty 30, 10d supply, fill #0

## 2024-01-31 MED ORDER — PENICILLIN V POTASSIUM 500 MG PO TABS
500.0000 mg | ORAL_TABLET | Freq: Three times a day (TID) | ORAL | 0 refills | Status: AC
  Filled 2024-01-31: qty 30, 10d supply, fill #0
